# Patient Record
Sex: Female | Born: 1955 | Race: White | Hispanic: No | Marital: Single | Smoking: Never smoker
Health system: Southern US, Community
[De-identification: ages and names within clinical notes are randomized; demographics above are authoritative.]

## PROBLEM LIST (undated history)

## (undated) DIAGNOSIS — F32A Depression, unspecified: Secondary | ICD-10-CM

## (undated) DIAGNOSIS — F329 Major depressive disorder, single episode, unspecified: Secondary | ICD-10-CM

## (undated) DIAGNOSIS — C801 Malignant (primary) neoplasm, unspecified: Secondary | ICD-10-CM

## (undated) HISTORY — PX: BREAST SURGERY: SHX581

## (undated) HISTORY — PX: ABDOMINAL HYSTERECTOMY: SHX81

---

## 2015-12-26 ENCOUNTER — Encounter: Payer: Self-pay | Admitting: *Deleted

## 2015-12-26 ENCOUNTER — Ambulatory Visit (INDEPENDENT_AMBULATORY_CARE_PROVIDER_SITE_OTHER): Payer: BC Managed Care – PPO

## 2015-12-26 ENCOUNTER — Ambulatory Visit
Admission: EM | Admit: 2015-12-26 | Discharge: 2015-12-26 | Disposition: A | Discharge status: Home / Self Care | Payer: BC Managed Care – PPO | Attending: Family Medicine | Admitting: Family Medicine

## 2015-12-26 DIAGNOSIS — M9420 Chondromalacia, unspecified site: Secondary | ICD-10-CM | POA: Diagnosis not present

## 2015-12-26 DIAGNOSIS — S76112A Strain of left quadriceps muscle, fascia and tendon, initial encounter: Secondary | ICD-10-CM

## 2015-12-26 DIAGNOSIS — M25462 Effusion, left knee: Secondary | ICD-10-CM

## 2015-12-26 HISTORY — DX: Malignant (primary) neoplasm, unspecified: C80.1

## 2015-12-26 MED ORDER — KETOROLAC TROMETHAMINE 60 MG/2ML IM SOLN
60.0000 mg | Freq: Once | INTRAMUSCULAR | Status: AC
  Administered 2015-12-26: 60 mg via INTRAMUSCULAR

## 2015-12-26 MED ORDER — NAPROXEN 500 MG PO TABS: 500.0000 mg | ORAL_TABLET | Freq: Two times a day (BID) | ORAL | 0 refills | Status: DC

## 2015-12-26 NOTE — ED Provider Notes (Signed)
CSN: [id number]     Arrival date & time 12/26/15  1841 History   None    Chief Complaint  Patient presents with  . Leg Pain   (Consider location/radiation/quality/duration/timing/severity/associated sxs/prior Treatment) HPI  60 year old female who presents with severe left knee pain anterior and superior ordered while she was at her eye doctor. 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 She did not have pain there but later in the day as she was going to the eye doctor he dilated her eyes and she noticed that the pain was increasing significantly during that time. Pain was so severe she left and drove here and was crying  with pain until she was seen. At the present time she has a marked effusion of the left knee. The pain is localized to the lateral patella and medially along with some retropatellar tenderness. She has difficulty extending her knee because of pain  Past Medical History:  Diagnosis Date  . Cancer [facility])    Past Surgical History:  Procedure Laterality Date  . ABDOMINAL HYSTERECTOMY    . BREAST SURGERY     History reviewed. No pertinent family history. Social History  Substance Use Topics  . Smoking status: Never Smoker  . Smokeless tobacco: Never Used  . Alcohol use No   OB History    No data available     Review of Systems  Constitutional: Positive for activity change. Negative for chills, fatigue and fever.  Musculoskeletal: Positive for arthralgias and myalgias.  All other systems reviewed and are negative.   Allergies  Review of patient's allergies indicates no known allergies.  Home Medications   Prior to Admission medications   Medication Sig Start Date End Date Taking? Authorizing Provider  buPROPion (WELLBUTRIN SR) 150 MG 12 hr tablet Take 150 mg by mouth 2 (two) times daily.   Yes Historical Provider, MD  escitalopram (LEXAPRO) 20 MG  tablet Take 20 mg by mouth daily.   Yes Historical Provider, MD  naproxen (NAPROSYN) 500 MG tablet Take 1 tablet (500 mg total) by mouth 2 (two) times daily with a meal. 12/26/15   Lorin Picket, PA-C   Meds Ordered and Administered this Visit   Medications  ketorolac (TORADOL) injection 60 mg (60 mg Intramuscular Given 12/26/15 1940)    BP 140/75 (BP Location: Left Arm)   Pulse 80   Temp 98.2 F (36.8 C) (Oral)   Resp 20   Ht 5\' 4"  (1.626 m)   Wt 132 lb (59.9 kg)   SpO2 100%   BMI 22.66 kg/m  No data found.   Physical Exam  Constitutional: She appears well-developed and well-nourished. No distress.  HENT:  Head: Normocephalic and atraumatic.  Eyes: EOM are normal. Pupils are equal, round, and reactive to light.  Neck: Normal range of motion. Neck supple.  Musculoskeletal: She exhibits edema and tenderness. She exhibits no deformity.  The left knee shows a 3+ effusion appears mostly suprapatellar. Patient is reluctant to straighten her knee and lift off the table but with support is able to hold it against gravity. She does have some mild retropatellar tenderness and tenderness over the lateral aspects of the patella. There is no palpable defect of the quadriceps insertion. Collateral ligaments are intact and strong and on not tender. There is no joint line tenderness. Inadequate examination was not possible due to the patient's tenderness  and resistance to examination. She did have a adequate response to the Toradol injection.  Skin: She is not diaphoretic.  Nursing note and vitals reviewed.   Urgent Care Course   Clinical Course    Procedures (including critical care time)  Labs Review Labs Reviewed - No data to display  Imaging Review Dg Knee Complete 4 Views Left  Result Date: 12/26/2015 CLINICAL DATA:  Acute worsening of chronic left knee pain. EXAM: LEFT KNEE - COMPLETE 4+ VIEW COMPARISON:  None. FINDINGS: There is no evidence of fracture or dislocation. There is  a large suprapatellar joint effusion. Mild osteoarthritic changes of the medial lateral compartment of the left knee joint are seen. IMPRESSION: No acute fracture or dislocation identified about the left knee. Large suprapatellar joint effusion, raising question for internal derangement of the knee. Mild osteoarthritic changes of the medial and lateral compartment of the knee joint. Electronically Signed   By: Fidela Salisbury M.D.   On: 12/26/2015 20:07    Visual Acuity Review  Right Eye Distance:   Left Eye Distance:   Bilateral Distance:    Right Eye Near:   Left Eye Near:    Bilateral Near:     Medications  ketorolac (TORADOL) injection 60 mg (60 mg Intramuscular Given 12/26/15 1940)  Knee immobilizer was placed on the left leg and she was given crutches  MDM   1. Effusion of left knee joint   2. Quadriceps muscle strain, left, initial encounter   3. Chondromalacia    Discharge Medication List as of 12/26/2015  8:40 PM    START taking these medications   Details  naproxen (NAPROSYN) 500 MG tablet Take 1 tablet (500 mg total) by mouth 2 (two) times daily with a meal., Starting Mon 12/26/2015, Normal      Plan: 1. Test/x-ray results and diagnosis reviewed with patient 2. rx as per orders; risks, benefits, potential side effects reviewed with patient 3. Recommend supportive treatment with Ice and elevation. Advised her to wear her knee immobilizer for everything except for personal care. She has an established orthopedic surgeon at [facility] whom she will call tomorrow for a follow-up appointment. I've given her Naprosyn for pain control and anti-inflammatory. 4. F/u prn if symptoms worsen or don't improve    Lorin Picket, PA-C 12/26/15 2125

## 2015-12-26 NOTE — ED Triage Notes (Signed)
Sudden onset left knee pain this afternoon. No injury involved, pain began while seated for an eye exam. No hx of previous knee problems. No edema or deformity.

## 2016-02-08 ENCOUNTER — Ambulatory Visit
Admission: EM | Admit: 2016-02-08 | Discharge: 2016-02-08 | Disposition: A | Discharge status: Home / Self Care | Payer: BC Managed Care – PPO | Attending: Family Medicine | Admitting: Family Medicine

## 2016-02-08 ENCOUNTER — Encounter: Payer: Self-pay | Admitting: *Deleted

## 2016-02-08 DIAGNOSIS — J069 Acute upper respiratory infection, unspecified: Secondary | ICD-10-CM | POA: Diagnosis not present

## 2016-02-08 MED ORDER — AZITHROMYCIN 250 MG PO TABS: ORAL_TABLET | ORAL | 0 refills | Status: DC

## 2016-02-08 NOTE — ED Triage Notes (Signed)
Sore throat at onset, non-productive cough, chest soreness and pain with coughing/ deep breathing, body aches, and fatigue since Sunday.

## 2016-02-08 NOTE — ED Provider Notes (Signed)
[facility]    CSN: [id number] Arrival date & time: 02/08/16  1627  First Provider Contact:  First MD Initiated Contact with Patient 02/08/16 1656        History   Chief Complaint Chief Complaint  Patient presents with  . Cough  . Pleurisy  . Generalized Body Aches  . Fatigue    HPI Morgan Bennett is a 60 y.o. female.   HPI: Patient presents today with symptoms of mild productive cough, sore throat, nasal congestion for the last few days. The mucus is yellow in color. Patient states that she's had some achiness as well. She is a Oncologist. She denies any documented fever, shortness of breath, chest pain. She does not have a history of asthma or COPD. She is not a smoker.  Past Medical History:  Diagnosis Date  . Cancer ([place])     There are no active problems to display for this patient.   Past Surgical History:  Procedure Laterality Date  . ABDOMINAL HYSTERECTOMY    . BREAST SURGERY      OB History    No data available       Home Medications    Prior to Admission medications   Medication Sig Start Date End Date Taking? Authorizing Provider  Anastrozole (ARIMIDEX PO) Take by mouth.   Yes Historical Provider, MD  buPROPion (WELLBUTRIN SR) 150 MG 12 hr tablet Take 150 mg by mouth 2 (two) times daily.   Yes Historical Provider, MD  calcium carbonate 1250 MG capsule Take 1,250 mg by mouth 2 (two) times daily with a meal.   Yes Historical Provider, MD  cholecalciferol (VITAMIN D) 1000 units tablet Take 1,000 Units by mouth daily.   Yes Historical Provider, MD  escitalopram (LEXAPRO) 20 MG tablet Take 20 mg by mouth daily.   Yes Historical Provider, MD  naproxen (NAPROSYN) 500 MG tablet Take 1 tablet (500 mg total) by mouth 2 (two) times daily with a meal. 12/26/15   Lorin Picket, PA-C    Family History History reviewed. No pertinent family history.  Social History Social History  Substance Use Topics  . Smoking status: Never Smoker  .  Smokeless tobacco: Never Used  . Alcohol use No     Allergies   Review of patient's allergies indicates no known allergies.   Review of Systems Review of Systems: Negative excep tmentioned above.    Physical Exam Triage Vital Signs ED Triage Vitals  Enc Vitals Group     BP 02/08/16 1703 117/62     Pulse Rate 02/08/16 1703 80     Resp 02/08/16 1703 16     Temp 02/08/16 1703 98.8 F (37.1 C)     Temp Source 02/08/16 1703 Oral     SpO2 02/08/16 1703 100 %     Weight 02/08/16 1705 135 lb (61.2 kg)     Height 02/08/16 1705 5\' 4"  (1.626 m)     Head Circumference --      Peak Flow --      Pain Score --      Pain Loc --      Pain Edu? --      Excl. in [place]? --    No data found.   Updated Vital Signs BP 117/62 (BP Location: Right Arm)   Pulse 80   Temp 98.8 F (37.1 C) (Oral)   Resp 16   Ht 5\' 4"  (1.626 m)   Wt 135 lb (61.2 kg)   SpO2  100%   BMI 23.17 kg/m      Physical Exam:  GENERAL: NAD HEENT: no pharyngeal erythema, no exudate, no erythema of TMs, no cervical LAD RESP: CTA B, no accessory muscle use.  CARD: RRR NEURO: CN II-XII grossly intact    UC Treatments / Results  Labs (all labs ordered are listed, but only abnormal results are displayed) Labs Reviewed - No data to display  EKG  EKG Interpretation None       Radiology No results found.  Procedures Procedures (including critical care time)  Medications Ordered in UC Medications - No data to display   Initial Impression / Assessment and Plan / UC Course  I have reviewed the triage vital signs and the nursing notes.  Pertinent labs & imaging results that were available during my care of the patient were reviewed by me and considered in my medical decision making (see chart for details).  Clinical Course   A/P: URI- we'll treat patient with Z-Pak, Delsym when necessary, Claritin when necessary, Tylenol/Motrin when necessary, rest, hydration, seek medical attention if symptoms persist  or worsen as discussed.  Final Clinical Impressions(s) / UC Diagnoses   Final diagnoses:  None    New Prescriptions New Prescriptions   No medications on file     Paulina Fusi, MD 02/08/16 1736

## 2016-07-07 ENCOUNTER — Ambulatory Visit
Admission: EM | Admit: 2016-07-07 | Discharge: 2016-07-07 | Disposition: A | Discharge status: Home / Self Care | Payer: BC Managed Care – PPO | Attending: Family Medicine | Admitting: Family Medicine

## 2016-07-07 DIAGNOSIS — R69 Illness, unspecified: Secondary | ICD-10-CM | POA: Diagnosis not present

## 2016-07-07 DIAGNOSIS — J111 Influenza due to unidentified influenza virus with other respiratory manifestations: Secondary | ICD-10-CM

## 2016-07-07 MED ORDER — OSELTAMIVIR PHOSPHATE 75 MG PO CAPS
75.0000 mg | ORAL_CAPSULE | Freq: Two times a day (BID) | ORAL | 0 refills | Status: DC
Start: 2016-07-07 — End: 2017-04-09

## 2016-07-07 MED ORDER — BENZONATATE 200 MG PO CAPS: ORAL_CAPSULE | ORAL | 0 refills | Status: DC

## 2016-07-07 NOTE — ED Provider Notes (Signed)
CSN: [id number]     Arrival date & time 07/07/16  1531 History   First MD Initiated Contact with Patient 07/07/16 1657     Chief Complaint  Patient presents with  . Cough   (Consider location/radiation/quality/duration/timing/severity/associated sxs/prior Treatment) HPI  This a 61 year old female who presents with symptoms of coughing congestion bodyaches fever and severe chills. Symptoms began yesterday and worsened last night. She states that she is coughing frequently but not excessively. She does not cough throughout the night. Is been in the range of 99- 100 today is 99.2. Works as a Oncologist.      Past Medical History:  Diagnosis Date  . Cancer [facility])    Past Surgical History:  Procedure Laterality Date  . ABDOMINAL HYSTERECTOMY    . BREAST SURGERY     History reviewed. No pertinent family history. Social History  Substance Use Topics  . Smoking status: Never Smoker  . Smokeless tobacco: Never Used  . Alcohol use Yes     Comment: rarely   OB History    No data available     Review of Systems  Constitutional: Positive for activity change, chills, fatigue and fever.  HENT: Positive for congestion.   Respiratory: Positive for cough. Negative for shortness of breath, wheezing and stridor.   Musculoskeletal: Positive for myalgias.  All other systems reviewed and are negative.   Allergies  Patient has no known allergies.  Home Medications   Prior to Admission medications   Medication Sig Start Date End Date Taking? Authorizing Provider  Anastrozole (ARIMIDEX PO) Take by mouth.   Yes Historical Provider, MD  buPROPion (WELLBUTRIN SR) 150 MG 12 hr tablet Take 150 mg by mouth 2 (two) times daily.   Yes Historical Provider, MD  calcium carbonate 1250 MG capsule Take 1,250 mg by mouth 2 (two) times daily with a meal.   Yes Historical Provider, MD  cholecalciferol (VITAMIN D) 1000 units tablet Take 1,000 Units by mouth daily.   Yes Historical Provider, MD   escitalopram (LEXAPRO) 20 MG tablet Take 20 mg by mouth daily.   Yes Historical Provider, MD  benzonatate (TESSALON) 200 MG capsule Take one cap TID PRN cough 07/07/16   Lorin Picket, PA-C  oseltamivir (TAMIFLU) 75 MG capsule Take 1 capsule (75 mg total) by mouth every 12 (twelve) hours. 07/07/16   Lorin Picket, PA-C   Meds Ordered and Administered this Visit  Medications - No data to display  BP 106/63 (BP Location: Left Arm)   Pulse 87   Temp 99.2 F (37.3 C) (Oral)   Resp 16   Ht 5\' 3"  (1.6 m)   Wt 131 lb (59.4 kg)   SpO2 98%   BMI 23.21 kg/m  No data found.   Physical Exam  Constitutional: She is oriented to person, place, and time. She appears well-developed and well-nourished. No distress.  HENT:  Head: Normocephalic and atraumatic.  Right Ear: External ear normal.  Left Ear: External ear normal.  Nose: Nose normal.  Mouth/Throat: Oropharynx is clear and moist. No oropharyngeal exudate.  Eyes: EOM are normal. Pupils are equal, round, and reactive to light. Right eye exhibits no discharge. Left eye exhibits no discharge.  Neck: Normal range of motion. Neck supple.  Pulmonary/Chest: Effort normal and breath sounds normal. No respiratory distress. She has no wheezes. She has no rales.  Musculoskeletal: Normal range of motion.  Lymphadenopathy:    She has no cervical adenopathy.  Neurological: She is alert and oriented to  person, place, and time.  Skin: Skin is warm and dry. She is not diaphoretic.  Psychiatric: She has a normal mood and affect. Her behavior is normal. Judgment and thought content normal.  Nursing note and vitals reviewed.   Urgent Care Course     Procedures (including critical care time)  Labs Review Labs Reviewed - No data to display  Imaging Review No results found.   Visual Acuity Review  Right Eye Distance:   Left Eye Distance:   Bilateral Distance:    Right Eye Near:   Left Eye Near:    Bilateral Near:         MDM   1.  Influenza-like illness     Discharge Medication List as of 07/07/2016  5:13 PM    START taking these medications   Details  benzonatate (TESSALON) 200 MG capsule Take one cap TID PRN cough, Normal    oseltamivir (TAMIFLU) 75 MG capsule Take 1 capsule (75 mg total) by mouth every 12 (twelve) hours., Starting Sat 07/07/2016, Normal      Plan: 1. Test/x-ray results and diagnosis reviewed with patient 2. rx as per orders; risks, benefits, potential side effects reviewed with patient 3. Recommend supportive treatment with Fluids and rest. Use Motrin or Tylenol for body aches and fever. To stay out of school until her cough has subsided and her fever has abated. Is not improving should follow-up with her primary care physician 4. F/u prn if symptoms worsen or don't improve    Lorin Picket, PA-C 07/07/16 1720

## 2016-07-07 NOTE — ED Triage Notes (Signed)
Patient complains of cough, congestion, body aches, fevers. 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

## 2017-04-09 ENCOUNTER — Encounter: Payer: Self-pay | Admitting: Emergency Medicine

## 2017-04-09 ENCOUNTER — Ambulatory Visit
Admission: EM | Admit: 2017-04-09 | Discharge: 2017-04-09 | Disposition: A | Discharge status: Home / Self Care | Payer: BC Managed Care – PPO | Attending: Family Medicine | Admitting: Family Medicine

## 2017-04-09 DIAGNOSIS — J069 Acute upper respiratory infection, unspecified: Secondary | ICD-10-CM | POA: Diagnosis not present

## 2017-04-09 DIAGNOSIS — R05 Cough: Secondary | ICD-10-CM

## 2017-04-09 MED ORDER — ALBUTEROL SULFATE HFA 108 (90 BASE) MCG/ACT IN AERS: 1.0000 | INHALATION_SPRAY | Freq: Four times a day (QID) | RESPIRATORY_TRACT | 0 refills | Status: DC | PRN

## 2017-04-09 MED ORDER — HYDROCOD POLST-CPM POLST ER 10-8 MG/5ML PO SUER: 5.0000 mL | Freq: Two times a day (BID) | ORAL | 0 refills | Status: DC

## 2017-04-09 NOTE — ED Triage Notes (Signed)
Patient c/o cough, chest congestion and bodyaches since Saturday.

## 2017-04-09 NOTE — ED Provider Notes (Signed)
[facility]    CSN: [id number] Arrival date & time: 04/09/17  1143     History   Chief Complaint Chief Complaint  Patient presents with  . Cough    HPI Morgan Bennett is a 61 y.o. female.   HPI  This a 61 year old female who presents with cough chest congestion and body aches that she's had since Saturday 3 days prior to this visit. In addition she is complaining of pain the medial scapular border on the left near the superior spine. Is non-radiating. She states that it hurts with deep breathing or coughing. It feels better when she leans forward. She states that last night she had a particularly long coughing spell and that she has the beside the bed to relieve the pain in her back. She's not had a fever or chills. O2 sats are 100% on room air.        Past Medical History:  Diagnosis Date  . Cancer ([place])     There are no active problems to display for this patient.   Past Surgical History:  Procedure Laterality Date  . ABDOMINAL HYSTERECTOMY    . BREAST SURGERY      OB History    No data available       Home Medications    Prior to Admission medications   Medication Sig Start Date End Date Taking? Authorizing Provider  albuterol (PROVENTIL HFA;VENTOLIN HFA) 108 (90 Base) MCG/ACT inhaler Inhale 1-2 puffs every 6 (six) hours as needed into the lungs for wheezing or shortness of breath. Use with spacer 04/09/17   Lorin Picket, PA-C  Anastrozole (ARIMIDEX PO) Take by mouth.    [provider]  buPROPion (WELLBUTRIN SR) 150 MG 12 hr tablet Take 150 mg by mouth 2 (two) times daily.    [provider]  calcium carbonate 1250 MG capsule Take 1,250 mg by mouth 2 (two) times daily with a meal.    [provider]  chlorpheniramine-HYDROcodone (TUSSIONEX PENNKINETIC ER) 10-8 MG/5ML SUER Take 5 mLs 2 (two) times daily by mouth. 04/09/17   Lorin Picket, PA-C  cholecalciferol (VITAMIN D) 1000 units tablet Take 1,000 Units by  mouth daily.    [provider]  escitalopram (LEXAPRO) 20 MG tablet Take 20 mg by mouth daily.    [provider]    Family History History reviewed. No pertinent family history.  Social History Social History   Tobacco Use  . Smoking status: Never Smoker  . Smokeless tobacco: Never Used  Substance Use Topics  . Alcohol use: Yes    Comment: rarely  . Drug use: No     Allergies   Patient has no known allergies.   Review of Systems Review of Systems  Constitutional: Positive for activity change and fatigue. Negative for chills and fever.  HENT: Positive for congestion and sore throat.   Respiratory: Positive for cough and shortness of breath.   Musculoskeletal: Positive for myalgias.  All other systems reviewed and are negative.    Physical Exam Triage Vital Signs ED Triage Vitals  Enc Vitals Group     BP 04/09/17 1201 129/81     Pulse Rate 04/09/17 1201 87     Resp 04/09/17 1201 14     Temp 04/09/17 1201 98.6 F (37 C)     Temp Source 04/09/17 1201 Oral     SpO2 04/09/17 1201 100 %     Weight 04/09/17 1158 125 lb (56.7 kg)  Height 04/09/17 1158 5\' 4"  (1.626 m)     Head Circumference --      Peak Flow --      Pain Score 04/09/17 1159 5     Pain Loc --      Pain Edu? --      Excl. in [place]? --    No data found.  Updated Vital Signs BP 129/81 (BP Location: Left Arm)   Pulse 87   Temp 98.6 F (37 C) (Oral)   Resp 14   Ht 5\' 4"  (1.626 m)   Wt 125 lb (56.7 kg)   SpO2 100%   BMI 21.46 kg/m   Visual Acuity Right Eye Distance:   Left Eye Distance:   Bilateral Distance:    Right Eye Near:   Left Eye Near:    Bilateral Near:     Physical Exam  Constitutional: She is oriented to person, place, and time.  HENT:  Head: Normocephalic.  Right Ear: External ear normal.  Left Ear: External ear normal.  Nose: Nose normal.  Mouth/Throat: Oropharynx is clear and moist. No oropharyngeal exudate.  Eyes: Pupils are equal, round, and  reactive to light. Right eye exhibits no discharge. Left eye exhibits no discharge.  Neck: Normal range of motion.  Pulmonary/Chest: Effort normal and breath sounds normal.  Musculoskeletal: She exhibits tenderness.   She does have point tends over left mediasenal border at the superior spine level. this reproduces her symptoms. There is no crepitus present.  Neurological: She is alert and oriented to person, place, and time.  Skin: Skin is warm and dry.  Psychiatric: She has a normal mood and affect. Her behavior is normal. Judgment and thought content normal.  Nursing note and vitals reviewed.    UC Treatments / Results  Labs (all labs ordered are listed, but only abnormal results are displayed) Labs Reviewed - No data to display  EKG  EKG Interpretation None       Radiology No results found.  Procedures Procedures (including critical care time)  Medications Ordered in UC Medications - No data to display   Initial Impression / Assessment and Plan / UC Course  I have reviewed the triage vital signs and the nursing notes.  Pertinent labs & imaging results that were available during my care of the patient were reviewed by me and considered in my medical decision making (see chart for details).     Plan: 1. Test/x-ray results and diagnosis reviewed with patient 2. rx as per orders; risks, benefits, potential side effects reviewed with patient 3. Recommend supportive treatment with rest and fluids. Discussion regarding use of Tussionex at night. She should not drive if taking the medication. If she is worsening or develops high fevers a more productive cough more shortness of breath that she should return to our clinic or the emergency room or her primary care physician. 4. F/u prn if symptoms worsen or don't improve   Final Clinical Impressions(s) / UC Diagnoses   Final diagnoses:  Upper respiratory tract infection, unspecified type    ED Discharge Orders         Ordered    chlorpheniramine-HYDROcodone (TUSSIONEX PENNKINETIC ER) 10-8 MG/5ML SUER  2 times daily     04/09/17 1255    albuterol (PROVENTIL HFA;VENTOLIN HFA) 108 (90 Base) MCG/ACT inhaler  Every 6 hours PRN    Comments:  Provide spacer and instructions to patient   04/09/17 1255       Controlled Substance Prescriptions [place] Controlled Substance  Registry consulted? Not Applicable   Lorin Picket, PA-C 04/09/17 1321

## 2017-09-09 ENCOUNTER — Ambulatory Visit: Payer: BC Managed Care – PPO | Admitting: Family Medicine

## 2017-09-09 ENCOUNTER — Encounter: Payer: Self-pay | Admitting: Family Medicine

## 2017-09-09 VITALS — BP 110/62 | HR 120 | Temp 99.3°F | Ht 64.0 in | Wt 132.0 lb

## 2017-09-09 DIAGNOSIS — J020 Streptococcal pharyngitis: Secondary | ICD-10-CM | POA: Diagnosis not present

## 2017-09-09 MED ORDER — AZITHROMYCIN 250 MG PO TABS
ORAL_TABLET | ORAL | 0 refills | Status: DC
Start: 2017-09-09 — End: 2017-11-26

## 2017-09-09 NOTE — Progress Notes (Signed)
Name: Morgan Bennett   MRN: [id number]    DOB: 30-Sep-1955   Date:09/09/2017       Progress Note  Subjective  Chief Complaint  Chief Complaint  Patient presents with  . Establish Care    pcp retired  . Sore Throat    feels "thick in the back of throat"- achy, but feels better today. No appetite    Sore Throat   The current episode started yesterday. The problem has been gradually worsening. Neither side of throat is experiencing more pain than the other. The maximum temperature recorded prior to her arrival was 100.4 - 100.9 F. The pain is at a severity of 4/10. The pain is moderate. Pertinent negatives include no abdominal pain, congestion, coughing, diarrhea, drooling, ear discharge, ear pain, headaches, hoarse voice, plugged ear sensation, neck pain, shortness of breath, stridor, swollen glands, trouble swallowing or vomiting. She has had no exposure to strep or mono. 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 The treatment provided moderate relief.    No problem-specific Assessment & Plan notes found for this encounter.   Past Medical History:  Diagnosis Date  . Cancer [facility])     Past Surgical History:  Procedure Laterality Date  . ABDOMINAL HYSTERECTOMY    . BREAST SURGERY     2 breast ca surgeries- mastectomy     Family History  Problem Relation Age of Onset  . Cancer Mother   . Cancer Sister     Social History   Socioeconomic History  . Marital status: Single    Spouse name: Not on file  . Number of children: Not on file  . Years of education: Not on file  . Highest education level: Not on file  Occupational History  . Not on file  Social Needs  . Financial resource strain: Not on file  . Food insecurity:    Worry: Not on file    Inability: Not on file  . Transportation needs:    Medical: Not on file    Non-medical: Not on file  Tobacco Use  . Smoking status: Never Smoker  . Smokeless tobacco: Never Used  Substance and Sexual Activity  . Alcohol use: Yes   Comment: rarely  . Drug use: No  . Sexual activity: Not Currently  Lifestyle  . Physical activity:    Days per week: Not on file    Minutes per session: Not on file  . Stress: Not on file  Relationships  . Social connections:    Talks on phone: Not on file    Gets together: Not on file    Attends religious service: Not on file    Active member of club or organization: Not on file    Attends meetings of clubs or organizations: Not on file    Relationship status: Not on file  . Intimate partner violence:    Fear of current or ex partner: Not on file    Emotionally abused: Not on file    Physically abused: Not on file    Forced sexual activity: Not on file  Other Topics Concern  . Not on file  Social History Narrative  . Not on file    No Known Allergies  Outpatient Medications Prior to Visit  Medication Sig Dispense Refill  . Anastrozole (ARIMIDEX PO) Take by mouth.    Marland Kitchen buPROPion (WELLBUTRIN SR) 150 MG 12 hr tablet Take 150 mg by mouth 2 (two) times daily. oncologist    . escitalopram (LEXAPRO) 20 MG tablet Take 20 mg  by mouth daily.    Marland Kitchen albuterol (PROVENTIL HFA;VENTOLIN HFA) 108 (90 Base) MCG/ACT inhaler Inhale 1-2 puffs every 6 (six) hours as needed into the lungs for wheezing or shortness of breath. Use with spacer 1 Inhaler 0  . calcium carbonate 1250 MG capsule Take 1,250 mg by mouth 2 (two) times daily with a meal.    . chlorpheniramine-HYDROcodone (TUSSIONEX PENNKINETIC ER) 10-8 MG/5ML SUER Take 5 mLs 2 (two) times daily by mouth. 115 mL 0  . cholecalciferol (VITAMIN D) 1000 units tablet Take 1,000 Units by mouth daily.     No facility-administered medications prior to visit.     Review of Systems  Constitutional: Negative for chills, fever, malaise/fatigue and weight loss.  HENT: Negative for congestion, drooling, ear discharge, ear pain, hoarse voice, sore throat and trouble swallowing.   Eyes: Negative for blurred vision.  Respiratory: Negative for cough,  sputum production, shortness of breath, wheezing and stridor.   Cardiovascular: Negative for chest pain, palpitations and leg swelling.  Gastrointestinal: Negative for abdominal pain, blood in stool, constipation, diarrhea, heartburn, melena, nausea and vomiting.  Genitourinary: Negative for dysuria, frequency, hematuria and urgency.  Musculoskeletal: Negative for back pain, joint pain, myalgias and neck pain.  Skin: Negative for rash.  Neurological: Negative for dizziness, tingling, sensory change, focal weakness and headaches.  Endo/Heme/Allergies: Negative for environmental allergies and polydipsia. Does not bruise/bleed easily.  Psychiatric/Behavioral: Negative for depression and suicidal ideas. The patient is not nervous/anxious and does not have insomnia.      Objective  Vitals:   09/09/17 1522  BP: 110/62  Pulse: (!) 120  Temp: 99.3 F (37.4 C)  TempSrc: Oral  Weight: 132 lb (59.9 kg)  Height: 5\' 4"  (1.626 m)    Physical Exam  Constitutional: She is well-developed, well-nourished, and in no distress. No distress.  HENT:  Head: Normocephalic and atraumatic.  Right Ear: Tympanic membrane, external ear and ear canal normal.  Left Ear: Tympanic membrane, external ear and ear canal normal.  Nose: Nose normal. Right sinus exhibits no maxillary sinus tenderness and no frontal sinus tenderness. Left sinus exhibits no maxillary sinus tenderness and no frontal sinus tenderness.  Mouth/Throat: Uvula is midline, oropharynx is clear and moist and mucous membranes are normal. No oropharyngeal exudate, posterior oropharyngeal edema or posterior oropharyngeal erythema.  Eyes: Pupils are equal, round, and reactive to light. Conjunctivae and EOM are normal. Right eye exhibits no discharge. Left eye exhibits no discharge.  Neck: Normal range of motion. Neck supple. No JVD present. No thyromegaly present.  Cardiovascular: Normal rate, regular rhythm, normal heart sounds and intact distal  pulses. Exam reveals no gallop and no friction rub.  No murmur heard. Pulmonary/Chest: Effort normal and breath sounds normal. She has no wheezes. She has no rales.  Abdominal: Soft. Bowel sounds are normal. She exhibits no mass. There is no tenderness. There is no guarding.  Musculoskeletal: Normal range of motion. She exhibits no edema.  Lymphadenopathy:       Head (right side): Submandibular adenopathy present.       Head (left side): Submandibular adenopathy present.    She has no cervical adenopathy.  Neurological: She is alert. She has normal reflexes.  Skin: Skin is warm and dry. She is not diaphoretic.  Psychiatric: Mood and affect normal.  Nursing note and vitals reviewed.     Assessment & Plan  Problem List Items Addressed This Visit    None    Visit Diagnoses    Pharyngitis due to Streptococcus  species    -  Primary   Relevant Medications   azithromycin (ZITHROMAX) 250 MG tablet      Meds ordered this encounter  Medications  . azithromycin (ZITHROMAX) 250 MG tablet    Sig: 2 today then 1 a day for 4 days    Dispense:  6 tablet    Refill:  0      Dr. [facility]  09/09/17

## 2017-09-27 ENCOUNTER — Other Ambulatory Visit: Payer: Self-pay

## 2017-09-27 ENCOUNTER — Telehealth: Payer: Self-pay

## 2017-09-27 MED ORDER — AMOXICILLIN 500 MG PO CAPS: 500.0000 mg | ORAL_CAPSULE | Freq: Three times a day (TID) | ORAL | 0 refills | Status: DC

## 2017-09-27 NOTE — Telephone Encounter (Signed)
Pt called in with sore throat again, low grade fever. Sent in Amox to [facility]

## 2017-10-30 ENCOUNTER — Encounter: Payer: Self-pay | Admitting: Emergency Medicine

## 2017-10-30 ENCOUNTER — Ambulatory Visit (INDEPENDENT_AMBULATORY_CARE_PROVIDER_SITE_OTHER): Payer: BC Managed Care – PPO

## 2017-10-30 ENCOUNTER — Other Ambulatory Visit: Payer: Self-pay

## 2017-10-30 ENCOUNTER — Ambulatory Visit
Admission: EM | Admit: 2017-10-30 | Discharge: 2017-10-30 | Disposition: A | Discharge status: Home / Self Care | Payer: BC Managed Care – PPO | Attending: Family Medicine | Admitting: Family Medicine

## 2017-10-30 DIAGNOSIS — R079 Chest pain, unspecified: Secondary | ICD-10-CM | POA: Diagnosis not present

## 2017-10-30 DIAGNOSIS — Z79899 Other long term (current) drug therapy: Secondary | ICD-10-CM | POA: Insufficient documentation

## 2017-10-30 DIAGNOSIS — R0789 Other chest pain: Secondary | ICD-10-CM | POA: Diagnosis not present

## 2017-10-30 DIAGNOSIS — R509 Fever, unspecified: Secondary | ICD-10-CM | POA: Insufficient documentation

## 2017-10-30 DIAGNOSIS — R05 Cough: Secondary | ICD-10-CM | POA: Diagnosis not present

## 2017-10-30 DIAGNOSIS — I517 Cardiomegaly: Secondary | ICD-10-CM | POA: Diagnosis not present

## 2017-10-30 DIAGNOSIS — Z9013 Acquired absence of bilateral breasts and nipples: Secondary | ICD-10-CM | POA: Diagnosis not present

## 2017-10-30 DIAGNOSIS — Z853 Personal history of malignant neoplasm of breast: Secondary | ICD-10-CM | POA: Insufficient documentation

## 2017-10-30 DIAGNOSIS — R918 Other nonspecific abnormal finding of lung field: Secondary | ICD-10-CM | POA: Insufficient documentation

## 2017-10-30 LAB — CBC WITH DIFFERENTIAL/PLATELET
Basophils Absolute: 0.1 10*3/uL (ref 0–0.1)
Basophils Relative: 1 %
EOS ABS: 0.1 10*3/uL (ref 0–0.7)
EOS PCT: 0 %
HCT: 36.3 % (ref 35.0–47.0)
Hemoglobin: 12.2 g/dL (ref 12.0–16.0)
LYMPHS ABS: 1.4 10*3/uL (ref 1.0–3.6)
LYMPHS PCT: 12 %
MCH: 31.2 pg (ref 26.0–34.0)
MCHC: 33.7 g/dL (ref 32.0–36.0)
MCV: 92.6 fL (ref 80.0–100.0)
MONO ABS: 0.8 10*3/uL (ref 0.2–0.9)
MONOS PCT: 7 %
Neutro Abs: 9.5 10*3/uL — ABNORMAL HIGH (ref 1.4–6.5)
Neutrophils Relative %: 80 %
PLATELETS: 303 10*3/uL (ref 150–440)
RBC: 3.92 MIL/uL (ref 3.80–5.20)
RDW: 12.9 % (ref 11.5–14.5)
WBC: 11.8 10*3/uL — AB (ref 3.6–11.0)

## 2017-10-30 MED ORDER — IBUPROFEN 600 MG PO TABS
600.0000 mg | ORAL_TABLET | Freq: Once | ORAL | Status: AC
  Administered 2017-10-30: 600 mg via ORAL

## 2017-10-30 NOTE — ED Provider Notes (Signed)
[facility]    CSN: [id number] Arrival date & time: 10/30/17  1841     History   Chief Complaint Chief Complaint  Patient presents with  . Chest Pain    HPI Morgan Bennett is a 62 y.o. female.   62 yo female with a c/o sharp chest pains since this afternoon worse when taking a deep breath. Patient states that she's been ill on and off for the past month with respiratory symptoms and was treated with antibiotics by PCP. States she's had a chronic cough that keeps her up at night. Sharp chest pain is localized to the left upper chest area. Denies any calf pain, hormonal treatment or prolong immobilization. Has a h/o breast cancer for which she follows up with her oncologist at [facility] in [place] (records not available in [place]). Patient states this has been in remission for the past 20 years. Also states she has a h/o osteoporosis and has had fractured ribs in the past.   The history is provided by the patient.  Chest Pain    Past Medical History:  Diagnosis Date  . Cancer ([place])     There are no active problems to display for this patient.   Past Surgical History:  Procedure Laterality Date  . ABDOMINAL HYSTERECTOMY    . BREAST SURGERY     2 breast ca surgeries- mastectomy     OB History   None      Home Medications    Prior to Admission medications   Medication Sig Start Date End Date Taking? Authorizing Provider  Anastrozole (ARIMIDEX PO) Take by mouth.   Yes [provider]  buPROPion (WELLBUTRIN SR) 150 MG 12 hr tablet Take 150 mg by mouth 2 (two) times daily. oncologist   Yes [provider]  escitalopram (LEXAPRO) 20 MG tablet Take 20 mg by mouth daily.   Yes [provider]  amoxicillin (AMOXIL) 500 MG capsule Take 1 capsule (500 mg total) by mouth 3 (three) times daily. 09/27/17   Juline Patch, MD  azithromycin (ZITHROMAX) 250 MG tablet 2 today then 1 a day for 4 days 09/09/17   Juline Patch, MD    Family  History Family History  Problem Relation Age of Onset  . Cancer Mother   . Cancer Sister   . COPD Father     Social History Social History   Tobacco Use  . Smoking status: Never Smoker  . Smokeless tobacco: Never Used  Substance Use Topics  . Alcohol use: Yes    Comment: rarely  . Drug use: No     Allergies   Patient has no known allergies.   Review of Systems Review of Systems  Cardiovascular: Positive for chest pain.     Physical Exam Triage Vital Signs ED Triage Vitals  Enc Vitals Group     BP 10/30/17 1848 (!) 160/79     Pulse Rate 10/30/17 1848 99     Resp 10/30/17 1848 20     Temp 10/30/17 1848 100.1 F (37.8 C)     Temp Source 10/30/17 1848 Oral     SpO2 10/30/17 1848 100 %     Weight 10/30/17 1850 135 lb (61.2 kg)     Height 10/30/17 1850 5\' 4"  (1.626 m)     Head Circumference --      Peak Flow --      Pain Score 10/30/17 1849 8     Pain Loc --  Pain Edu? --      Excl. in [place]? --    No data found.  Updated Vital Signs BP (!) 160/79 (BP Location: Right Arm)   Pulse 99   Temp 100.1 F (37.8 C) (Oral)   Resp 20   Ht 5\' 4"  (1.626 m)   Wt 135 lb (61.2 kg)   SpO2 100%   BMI 23.17 kg/m   Visual Acuity Right Eye Distance:   Left Eye Distance:   Bilateral Distance:    Right Eye Near:   Left Eye Near:    Bilateral Near:     Physical Exam  Constitutional: She appears well-developed and well-nourished. No distress.  Cardiovascular: Normal rate, regular rhythm, normal heart sounds and intact distal pulses.  Pulmonary/Chest: Effort normal and breath sounds normal. No stridor. No respiratory distress. She has no wheezes. She has no rales. She exhibits tenderness (left chest wall; reproducible).  Abdominal: Soft. Bowel sounds are normal. She exhibits no distension.  Musculoskeletal: She exhibits no edema or tenderness.  Skin: No rash noted. She is not diaphoretic.  Nursing note and vitals reviewed.    UC Treatments / Results  Labs (all  labs ordered are listed, but only abnormal results are displayed) Labs Reviewed  CBC WITH DIFFERENTIAL/PLATELET - Abnormal; Notable for the following components:      Result Value   WBC 11.8 (*)    Neutro Abs 9.5 (*)    All other components within normal limits    EKG None  Radiology Dg Chest 2 View  Result Date: 10/30/2017 CLINICAL DATA:  History of left breast cancer. Left anterior chest pain. Fevers. EXAM: CHEST - 2 VIEW COMPARISON:  None. FINDINGS: There is a mass in the left mid lung measuring 5.6 by 2.7 cm. This is denser than adjacent bone, consistent with a calcified mass. Along the superior aspect of this mass is some nodularity which is probably but not definitively calcified. There is increased density in the left apex, asymmetric to the right. No other nodules or masses. Cardiomediastinal silhouette is normal. No other acute abnormalities. IMPRESSION: 1. There is a calcified mass in the left lung. Adjacent nodularity is probably calcified but not definitively, likely part of the same process. 2. Increased heterogeneous density in the left apex. This region is obscured by overlapping bones but the finding is asymmetric to the right. Recommend comparison to previous outside films if available. If none are available, recommend CT imaging. Electronically Signed   By: Dorise Bullion III M.D   On: 10/30/2017 19:58   Dg Ribs Unilateral Left  Result Date: 10/30/2017 CLINICAL DATA:  Fever for several months.  Left anterior chest pain. EXAM: LEFT RIBS - 2 VIEW COMPARISON:  None. FINDINGS: Mild increased density in the left apex, asymmetric to the right. Calcified mass in the left mid lung. Adjacent nodularity may be but is not definitely calcified as well. No rib fractures or bony lesions identified. IMPRESSION: 1. Lung findings as above. See the chest x-ray report and recommendations. 2. No bony lesions identified. Electronically Signed   By: Dorise Bullion III M.D   On: 10/30/2017 20:00     Procedures ED EKG Date/Time: 10/30/2017 7:32 PM Performed by: Norval Gable, MD Authorized by: Norval Gable, MD   ECG reviewed by ED Physician in the absence of a cardiologist: no   Previous ECG:    Previous ECG:  Unavailable Interpretation:    Interpretation: normal   Rate:    ECG rate:  97   ECG  rate assessment: normal   Rhythm:    Rhythm: sinus rhythm   Ectopy:    Ectopy: none   QRS:    QRS axis:  Normal Conduction:    Conduction: normal   ST segments:    ST segments:  Normal T waves:    T waves: normal   Q waves:    Q waves:  V1 and V2   (including critical care time)  Medications Ordered in UC Medications  ibuprofen (ADVIL,MOTRIN) tablet 600 mg (600 mg Oral Given 10/30/17 1931)    Initial Impression / Assessment and Plan / UC Course  I have reviewed the triage vital signs and the nursing notes.  Pertinent labs & imaging results that were available during my care of the patient were reviewed by me and considered in my medical decision making (see chart for details).      Final Clinical Impressions(s) / UC Diagnoses   Final diagnoses:  Mass of left lung     Discharge Instructions     Recommend follow up with your oncologist tomorrow for further evaluation    ED Prescriptions    None     1. Labs/x-ray results and diagnosis reviewed with patient 2. Recommend patient follow up with her oncologist tomorrow for further evaluation 3. Offered rx for cough syrup, however patient declined.    Controlled Substance Prescriptions [place] Controlled Substance Registry consulted? Not Applicable   Norval Gable, MD 10/30/17 2119

## 2017-10-30 NOTE — Discharge Instructions (Signed)
Recommend follow up with your oncologist tomorrow for further evaluation

## 2017-10-30 NOTE — ED Triage Notes (Signed)
Patient c/o chest pain that started this afternoon, denies nausea and vomiting. States pain got worse over the last 30 min. Describes pain as a stabbing pain.

## 2017-11-26 ENCOUNTER — Other Ambulatory Visit: Payer: Self-pay

## 2017-11-26 ENCOUNTER — Ambulatory Visit
Admission: EM | Admit: 2017-11-26 | Discharge: 2017-11-26 | Disposition: A | Discharge status: Home / Self Care | Payer: BC Managed Care – PPO | Attending: Family Medicine | Admitting: Family Medicine

## 2017-11-26 DIAGNOSIS — R112 Nausea with vomiting, unspecified: Secondary | ICD-10-CM | POA: Diagnosis not present

## 2017-11-26 DIAGNOSIS — R1031 Right lower quadrant pain: Secondary | ICD-10-CM | POA: Diagnosis not present

## 2017-11-26 HISTORY — DX: Depression, unspecified: F32.A

## 2017-11-26 HISTORY — DX: Major depressive disorder, single episode, unspecified: F32.9

## 2017-11-26 LAB — COMPREHENSIVE METABOLIC PANEL
ALBUMIN: 4.3 g/dL (ref 3.5–5.0)
ALT: 14 U/L (ref 0–44)
AST: 22 U/L (ref 15–41)
Alkaline Phosphatase: 60 U/L (ref 38–126)
Anion gap: 13 (ref 5–15)
BILIRUBIN TOTAL: 0.5 mg/dL (ref 0.3–1.2)
BUN: 22 mg/dL (ref 8–23)
CO2: 23 mmol/L (ref 22–32)
CREATININE: 0.74 mg/dL (ref 0.44–1.00)
Calcium: 9.2 mg/dL (ref 8.9–10.3)
Chloride: 100 mmol/L (ref 98–111)
GFR calc Af Amer: >60 mL/min (ref >60)
GFR calc non Af Amer: >60 mL/min (ref >60)
GLUCOSE: 112 mg/dL — AB (ref 70–99)
Potassium: 4 mmol/L (ref 3.5–5.1)
Sodium: 136 mmol/L (ref 135–145)
Total Protein: 7.8 g/dL (ref 6.5–8.1)

## 2017-11-26 LAB — CBC WITH DIFFERENTIAL/PLATELET
BASOS ABS: 0.1 10*3/uL (ref 0–0.1)
BASOS PCT: 1 %
Eosinophils Absolute: 0.1 10*3/uL (ref 0–0.7)
Eosinophils Relative: 1 %
HEMATOCRIT: 38.4 % (ref 35.0–47.0)
Hemoglobin: 13.1 g/dL (ref 12.0–16.0)
LYMPHS PCT: 27 %
Lymphs Abs: 1.9 10*3/uL (ref 1.0–3.6)
MCH: 30.9 pg (ref 26.0–34.0)
MCHC: 34.1 g/dL (ref 32.0–36.0)
MCV: 90.8 fL (ref 80.0–100.0)
MONO ABS: 0.6 10*3/uL (ref 0.2–0.9)
Monocytes Relative: 8 %
NEUTROS ABS: 4.4 10*3/uL (ref 1.4–6.5)
NEUTROS PCT: 63 %
Platelets: 282 10*3/uL (ref 150–440)
RBC: 4.23 MIL/uL (ref 3.80–5.20)
RDW: 12.5 % (ref 11.5–14.5)
WBC: 7 10*3/uL (ref 3.6–11.0)

## 2017-11-26 MED ORDER — KETOROLAC TROMETHAMINE 30 MG/ML IJ SOLN
60.0000 mg | Freq: Once | INTRAMUSCULAR | Status: AC
  Administered 2017-11-26: 60 mg via INTRAMUSCULAR

## 2017-11-26 NOTE — Discharge Instructions (Addendum)
Go straight to the hospital.  Take care  Dr. Lacinda Axon   ** Patient with severe RLQ pain. Labs unremarkable. Needs imaging. Thank you. Thersa Salt DO

## 2017-11-26 NOTE — ED Provider Notes (Signed)
[facility]  CSN: [id number] Arrival date & time: 11/26/17  1350  History   Chief Complaint Chief Complaint  Patient presents with  . Abdominal Pain   HPI  62 year old female presents with severe abdominal pain.  Patient reports that she has been on antibiotic therapy recently for pneumonia.  She has just completed her antibiotic.  She states that for the past few days she has had mild abdominal pain.  Worsened  this afternoon.  She reports severe lower abdominal pain.  She has had approximately 4 episodes of emesis today as well.  She had a normal bowel movement earlier today.  No diarrhea.  Her pain is currently severe, 9/10 in severity.  No medications or interventions tried.  No other associated symptoms.  No other complaints.  Past Medical History:  Diagnosis Date  . Cancer ([place])   . Depression    Past Surgical History:  Procedure Laterality Date  . ABDOMINAL HYSTERECTOMY    . BREAST SURGERY     2 breast ca surgeries- mastectomy    OB History   None    Home Medications    Prior to Admission medications   Medication Sig Start Date End Date Taking? Authorizing Provider  Anastrozole (ARIMIDEX PO) Take by mouth.    [provider]  buPROPion (WELLBUTRIN SR) 150 MG 12 hr tablet Take 150 mg by mouth 2 (two) times daily. oncologist    [provider]  escitalopram (LEXAPRO) 20 MG tablet Take 20 mg by mouth daily.    [provider]   Family History Family History  Problem Relation Age of Onset  . Cancer Mother   . Cancer Sister   . COPD Father    Social History Social History   Tobacco Use  . Smoking status: Never Smoker  . Smokeless tobacco: Never Used  Substance Use Topics  . Alcohol use: Yes    Comment: rarely  . Drug use: No     Allergies   Patient has no known allergies.   Review of Systems Review of Systems  Constitutional: Negative for fever.  Gastrointestinal: Positive for abdominal pain, nausea and  vomiting. Negative for constipation and diarrhea.   Physical Exam Triage Vital Signs ED Triage Vitals [11/26/17 1400]  Enc Vitals Group     BP (!) 147/77     Pulse Rate 75     Resp 20     Temp 97.8 F (36.6 C)     Temp Source Oral     SpO2 100 %     Weight 135 lb (61.2 kg)     Height 5\' 4"  (1.626 m)     Head Circumference      Peak Flow      Pain Score 9     Pain Loc      Pain Edu?      Excl. in [place]?    Updated Vital Signs BP (!) 147/77 (BP Location: Left Arm)   Pulse 75   Temp 97.8 F (36.6 C) (Oral)   Resp 20   Ht 5\' 4"  (1.626 m)   Wt 135 lb (61.2 kg)   SpO2 100%   BMI 23.17 kg/m   Physical Exam  Constitutional: She is oriented to person, place, and time. She appears well-developed.  In distress secondary to pain.  HENT:  Head: Normocephalic and atraumatic.  Cardiovascular: Normal rate and regular rhythm.  Pulmonary/Chest: Effort normal and breath sounds normal. She has no wheezes. She has no rales.  Abdominal: Soft. She exhibits no distension.  Patient with tenderness to palpation in the right lower quadrant.  Neurological: She is alert and oriented to person, place, and time.  Nursing note and vitals reviewed.  UC Treatments / Results  Labs (all labs ordered are listed, but only abnormal results are displayed) Labs Reviewed  COMPREHENSIVE METABOLIC PANEL - Abnormal; Notable for the following components:      Result Value   Glucose, Bld 112 (*)    All other components within normal limits  CBC WITH DIFFERENTIAL/PLATELET    EKG None  Radiology No results found.  Procedures Procedures (including critical care time)  Medications Ordered in UC Medications  ketorolac (TORADOL) 30 MG/ML injection 60 mg (60 mg Intramuscular Given 11/26/17 1443)    Initial Impression / Assessment and Plan / UC Course  I have reviewed the triage vital signs and the nursing notes.  Pertinent labs & imaging results that were available during my care of the patient were  reviewed by me and considered in my medical decision making (see chart for details).    62 year old female presents with severe right lower quadrant pain.  Labs obtained and were unrevealing.  Patient needs imaging.  Patient was given Toradol here after her renal function was normal.  Patient going to [facility] ER.  Final Clinical Impressions(s) / UC Diagnoses   Final diagnoses:  RLQ abdominal pain     Discharge Instructions     Go straight to the hospital.  Take care  Dr. Lacinda Axon   ** Patient with severe RLQ pain. Labs unremarkable. Needs imaging. Thank you. Thersa Salt DO    ED Prescriptions    None     Controlled Substance Prescriptions [place] Controlled Substance Registry consulted? Not Applicable   Coral Spikes, DO 11/26/17 1452

## 2017-11-26 NOTE — ED Triage Notes (Signed)
Several days of low grade abd pain but today got much worse low across abdomen and almost feels like a bad period cramp. Had one episode of vomiting today. Had normal BM today. Pain 9/10

## 2017-12-02 ENCOUNTER — Other Ambulatory Visit: Payer: Self-pay

## 2017-12-02 ENCOUNTER — Ambulatory Visit: Payer: BC Managed Care – PPO | Admitting: Family Medicine

## 2017-12-02 ENCOUNTER — Encounter: Payer: Self-pay | Admitting: Family Medicine

## 2017-12-02 VITALS — BP 138/70 | HR 72 | Ht 64.0 in | Wt 140.0 lb

## 2017-12-02 DIAGNOSIS — N200 Calculus of kidney: Secondary | ICD-10-CM

## 2017-12-02 NOTE — Progress Notes (Signed)
Name: Morgan Bennett   MRN: [id number]    DOB: [birth year]   Date:12/02/2017       Progress Note  Subjective  Chief Complaint  Chief Complaint  Patient presents with  . Nephrolithiasis    Dx by CT of abdomen in ER. Feels like stone may be in bladder now because has been free of pain and nausea today.    Flank Pain  This is a new problem. The current episode started in the past 7 days (6 days). The problem has been gradually improving (resoved presently) since onset. The quality of the pain is described as stabbing. The pain is at a severity of 1/10. The pain is mild. Pertinent negatives include no abdominal pain, bladder incontinence, bowel incontinence, chest pain, dysuria, fever, headaches, paresis, paresthesias, tingling or weight loss. She has tried analgesics for the symptoms.    No problem-specific Assessment & Plan notes found for this encounter.   Past Medical History:  Diagnosis Date  . Cancer ([place])   . Depression     Past Surgical History:  Procedure Laterality Date  . ABDOMINAL HYSTERECTOMY    . BREAST SURGERY     2 breast ca surgeries- mastectomy     Family History  Problem Relation Age of Onset  . Cancer Mother   . Cancer Sister   . COPD Father     Social History   Socioeconomic History  . Marital status: Single    Spouse name: Not on file  . Number of children: Not on file  . Years of education: Not on file  . Highest education level: Not on file  Occupational History  . Not on file  Social Needs  . Financial resource strain: Not on file  . Food insecurity:    Worry: Not on file    Inability: Not on file  . Transportation needs:    Medical: Not on file    Non-medical: Not on file  Tobacco Use  . Smoking status: Never Smoker  . Smokeless tobacco: Never Used  Substance and Sexual Activity  . Alcohol use: Yes    Comment: rarely  . Drug use: No  . Sexual activity: Not Currently  Lifestyle  . Physical activity:    Days per week: Not on file     Minutes per session: Not on file  . Stress: Not on file  Relationships  . Social connections:    Talks on phone: Not on file    Gets together: Not on file    Attends religious service: Not on file    Active member of club or organization: Not on file    Attends meetings of clubs or organizations: Not on file    Relationship status: Not on file  . Intimate partner violence:    Fear of current or ex partner: Not on file    Emotionally abused: Not on file    Physically abused: Not on file    Forced sexual activity: Not on file  Other Topics Concern  . Not on file  Social History Narrative  . Not on file    No Known Allergies  Outpatient Medications Prior to Visit  Medication Sig Dispense Refill  . Anastrozole (ARIMIDEX PO) Take by mouth.    Marland Kitchen buPROPion (WELLBUTRIN SR) 150 MG 12 hr tablet Take 150 mg by mouth 2 (two) times daily. oncologist    . escitalopram (LEXAPRO) 20 MG tablet Take 20 mg by mouth daily.    Marland Kitchen ketorolac (TORADOL) 10 MG tablet  Take by mouth.    . ondansetron (ZOFRAN-ODT) 4 MG disintegrating tablet Take by mouth.    . oxyCODONE (OXY IR/ROXICODONE) 5 MG immediate release tablet TK 1 T PO Q 6 H PRN P FOR UP TO 3 DAYS  0   No facility-administered medications prior to visit.     Review of Systems  Constitutional: Negative for chills, fever, malaise/fatigue and weight loss.  HENT: Negative for ear discharge, ear pain and sore throat.   Eyes: Negative for blurred vision.  Respiratory: Negative for cough, sputum production, shortness of breath and wheezing.   Cardiovascular: Negative for chest pain, palpitations and leg swelling.  Gastrointestinal: Negative for abdominal pain, blood in stool, bowel incontinence, constipation, diarrhea, heartburn, melena and nausea.  Genitourinary: Positive for flank pain. Negative for bladder incontinence, dysuria, frequency, hematuria and urgency.  Musculoskeletal: Negative for back pain, joint pain, myalgias and neck pain.  Skin:  Negative for rash.  Neurological: Negative for dizziness, tingling, sensory change, focal weakness, headaches and paresthesias.  Endo/Heme/Allergies: Negative for environmental allergies and polydipsia. Does not bruise/bleed easily.  Psychiatric/Behavioral: Negative for depression and suicidal ideas. The patient is not nervous/anxious and does not have insomnia.      Objective  Vitals:   12/02/17 1623  BP: 138/70  Pulse: 72  Weight: 140 lb (63.5 kg)  Height: 5\' 4"  (1.626 m)    Physical Exam  Constitutional: She is oriented to person, place, and time. She appears well-developed and well-nourished.  HENT:  Head: Normocephalic.  Right Ear: External ear normal.  Left Ear: External ear normal.  Mouth/Throat: Oropharynx is clear and moist.  Eyes: Pupils are equal, round, and reactive to light. Conjunctivae and EOM are normal. Lids are everted and swept, no foreign bodies found. Left eye exhibits no hordeolum. No foreign body present in the left eye. Right conjunctiva is not injected. Left conjunctiva is not injected. No scleral icterus.  Neck: Normal range of motion. Neck supple. No JVD present. No tracheal deviation present. No thyromegaly present.  Cardiovascular: Normal rate, regular rhythm, normal heart sounds and intact distal pulses. Exam reveals no gallop and no friction rub.  No murmur heard. Pulmonary/Chest: Effort normal and breath sounds normal. No respiratory distress. She has no wheezes. She has no rales.  Abdominal: Soft. Bowel sounds are normal. She exhibits no mass. There is no hepatosplenomegaly. There is no tenderness. There is no rebound and no guarding.  Musculoskeletal: Normal range of motion. She exhibits no edema or tenderness.  Lymphadenopathy:    She has no cervical adenopathy.  Neurological: She is alert and oriented to person, place, and time. She has normal strength. She displays normal reflexes. No cranial nerve deficit.  Skin: Skin is warm. No rash noted.   Psychiatric: She has a normal mood and affect. Her mood appears not anxious. She does not exhibit a depressed mood.  Nursing note and vitals reviewed.     Assessment & Plan  Problem List Items Addressed This Visit    None    Visit Diagnoses    Nephrolithiasis    -  Primary   refilled Oxycodone for pain   Relevant Medications   oxyCODONE (OXY IR/ROXICODONE) 5 MG immediate release tablet      No orders of the defined types were placed in this encounter.     Dr. [facility]  12/02/17

## 2017-12-02 NOTE — Patient Instructions (Signed)
Dietary Guidelines to Help Prevent Kidney Stones Kidney stones are deposits of minerals and salts that form inside your kidneys. Your risk of developing kidney stones may be greater depending on your diet, your lifestyle, the medicines you take, and whether you have certain medical conditions. Most people can reduce their chances of developing kidney stones by following the instructions below. Depending on your overall health and the type of kidney stones you tend to develop, your dietitian may give you more specific instructions. What are tips for following this plan? Reading food labels  Choose foods with "no salt added" or "low-salt" labels. Limit your sodium intake to less than 1500 mg per day.  Choose foods with calcium for each meal and snack. Try to eat about 300 mg of calcium at each meal. Foods that contain 200-500 mg of calcium per serving include: ? 8 oz (237 ml) of milk, fortified nondairy milk, and fortified fruit juice. ? 8 oz (237 ml) of kefir, yogurt, and soy yogurt. ? 4 oz (118 ml) of tofu. ? 1 oz of cheese. ? 1 cup (300 g) of dried figs. ? 1 cup (91 g) of cooked broccoli. ? 1-3 oz can of sardines or mackerel.  Most people need 1000 to 1500 mg of calcium each day. Talk to your dietitian about how much calcium is recommended for you. Shopping  Buy plenty of fresh fruits and vegetables. Most people do not need to avoid fruits and vegetables, even if they contain nutrients that may contribute to kidney stones.  When shopping for convenience foods, choose: ? Whole pieces of fruit. ? Premade salads with dressing on the side. ? Low-fat fruit and yogurt smoothies.  Avoid buying frozen meals or prepared deli foods.  Look for foods with live cultures, such as yogurt and kefir. Cooking  Do not add salt to food when cooking. Place a salt shaker on the table and allow each person to add his or her own salt to taste.  Use vegetable protein, such as beans, textured vegetable  protein (TVP), or tofu instead of meat in pasta, casseroles, and soups. Meal planning  Eat less salt, if told by your dietitian. To do this: ? Avoid eating processed or premade food. ? Avoid eating fast food.  Eat less animal protein, including cheese, meat, poultry, or fish, if told by your dietitian. To do this: ? Limit the number of times you have meat, poultry, fish, or cheese each week. Eat a diet free of meat at least 2 days a week. ? Eat only one serving each day of meat, poultry, fish, or seafood. ? When you prepare animal protein, cut pieces into small portion sizes. For most meat and fish, one serving is about the size of one deck of cards.  Eat at least 5 servings of fresh fruits and vegetables each day. To do this: ? Keep fruits and vegetables on hand for snacks. ? Eat 1 piece of fruit or a handful of berries with breakfast. ? Have a salad and fruit at lunch. ? Have two kinds of vegetables at dinner.  Limit foods that are high in a substance called oxalate. These include: ? Spinach. ? Rhubarb. ? Beets. ? Potato chips and french fries. ? Nuts.  If you regularly take a diuretic medicine, make sure to eat at least 1-2 fruits or vegetables high in potassium each day. These include: ? Avocado. ? Banana. ? Orange, prune, carrot, or tomato juice. ? Baked potato. ? Cabbage. ? Beans and split peas.   General instructions  Drink enough fluid to keep your urine clear or pale yellow. This is the most important thing you can do.  Talk to your health care provider and dietitian about taking daily supplements. Depending on your health and the cause of your kidney stones, you may be advised: ? Not to take supplements with vitamin C. ? To take a calcium supplement. ? To take a daily probiotic supplement. ? To take other supplements such as magnesium, fish oil, or vitamin B6.  Take all medicines and supplements as told by your health care provider.  Limit alcohol intake to no more  than 1 drink a day for nonpregnant women and 2 drinks a day for men. One drink equals 12 oz of beer, 5 oz of wine, or 1 oz of hard liquor.  Lose weight if told by your health care provider. Work with your dietitian to find strategies and an eating plan that works best for you. What foods are not recommended? Limit your intake of the following foods, or as told by your dietitian. Talk to your dietitian about specific foods you should avoid based on the type of kidney stones and your overall health. Grains Breads. Bagels. Rolls. Baked goods. Salted crackers. Cereal. Pasta. Vegetables Spinach. Rhubarb. Beets. Canned vegetables. Pickles. Olives. Meats and other protein foods Nuts. Nut butters. Large portions of meat, poultry, or fish. Salted or cured meats. Deli meats. Hot dogs. Sausages. Dairy Cheese. Beverages Regular soft drinks. Regular vegetable juice. Seasonings and other foods Seasoning blends with salt. Salad dressings. Canned soups. Soy sauce. Ketchup. Barbecue sauce. Canned pasta sauce. Casseroles. Pizza. Lasagna. Frozen meals. Potato chips. French fries. Summary  You can reduce your risk of kidney stones by making changes to your diet.  The most important thing you can do is drink enough fluid. You should drink enough fluid to keep your urine clear or pale yellow.  Ask your health care provider or dietitian how much protein from animal sources you should eat each day, and also how much salt and calcium you should have each day. This information is not intended to replace advice given to you by your health care provider. Make sure you discuss any questions you have with your health care provider. Document Released: 09/15/2010 Document Revised: 05/01/2016 Document Reviewed: 05/01/2016 Elsevier Interactive Patient Education  2018 Elsevier Inc.  

## 2017-12-03 NOTE — Addendum Note (Signed)
Addended by: Fredderick Severance on: 12/03/2017 10:51 AM   Modules accepted: Orders

## 2017-12-03 NOTE — Progress Notes (Signed)
12/04/2017 2:22 PM   Morgan Bennett [birth year] [id number]  Referring provider: Juline Patch, MD [address] [place], [place] [address]  Chief Complaint  Patient presents with  . Nephrolithiasis    HPI: Patient is a 62 year old Caucasian female who is referred by Dr. Juline Bennett for nephrolithiasis with her sister, Morgan Bennett.    She was first seen on 11/26/2017 at [facility] for right flan pain with vomiting.  CT scan 0.4 cm obstructing calculus in the proximal/mid right ureter with associated mild right hydroureteronephrosis. Slightly delayed right nephrogram likely in setting of urinary tract obstruction.  She then returned on 11/28/2017 with the same complaints.  RUS notes no hydronephrosis. No nephrolithiasis. The ureters are not well-visualized.  Underdistended bladder which limits evaluation.  Today, she is still having intermittent right flank pain that radiates to the right waist.  Pain 8/10.  she is complaining of nocturia x 2-3.  Patient denies any gross hematuria, dysuria or suprapubic/flank pain.  Patient denies any fevers, chills, nausea or vomiting.  Her UA is negative.    KUB on 12/04/2017 noted a 4 mm stone just below the pelvic brim on the right.    PMH: Past Medical History:  Diagnosis Date  . Cancer ([place])   . Depression     Surgical History: Past Surgical History:  Procedure Laterality Date  . ABDOMINAL HYSTERECTOMY    . BREAST SURGERY     2 breast ca surgeries- mastectomy     Home Medications:  Allergies as of 12/04/2017   No Known Allergies     Medication List        Accurate as of 12/04/17  2:22 PM. Always use your most recent med list.          ARIMIDEX PO Take by mouth.   buPROPion 150 MG 12 hr tablet Commonly known as:  WELLBUTRIN SR Take 150 mg by mouth 2 (two) times daily. oncologist   escitalopram 20 MG tablet Commonly known as:  LEXAPRO Take 20 mg by mouth daily.   ketorolac 10 MG tablet Commonly known as:  TORADOL Take  10 mg by mouth every 6 (six) hours as needed.   ondansetron 4 MG disintegrating tablet Commonly known as:  ZOFRAN-ODT Take by mouth.   oxyCODONE 5 MG immediate release tablet Commonly known as:  Oxy IR/ROXICODONE TK 1 T PO Q 6 H PRN P FOR UP TO 3 DAYS   tamsulosin 0.4 MG Caps capsule Commonly known as:  FLOMAX Take 1 capsule (0.4 mg total) by mouth daily.       Allergies: No Known Allergies  Family History: Family History  Problem Relation Age of Onset  . Cancer Mother   . Cancer Sister   . COPD Father     Social History:  reports that she has never smoked. She has never used smokeless tobacco. She reports that she drinks alcohol. She reports that she does not use drugs.  ROS: UROLOGY Frequent Urination?: No Hard to postpone urination?: No Burning/pain with urination?: No Get up at night to urinate?: Yes Leakage of urine?: No Urine stream starts and stops?: No Trouble starting stream?: No Do you have to strain to urinate?: No Blood in urine?: No Urinary tract infection?: No Sexually transmitted disease?: No Injury to kidneys or bladder?: No Painful intercourse?: No Weak stream?: No Currently pregnant?: No Vaginal bleeding?: No Last menstrual period?: n  Gastrointestinal Nausea?: Yes Vomiting?: No Indigestion/heartburn?: No Diarrhea?: No Constipation?: Yes  Constitutional  Fever: No Night sweats?: No Weight loss?: No Fatigue?: Yes  Skin Skin rash/lesions?: No Itching?: No  Eyes Blurred vision?: No Double vision?: No  Ears/Nose/Throat Sore throat?: No Sinus problems?: No  Hematologic/Lymphatic Swollen glands?: No Easy bruising?: Yes  Cardiovascular Leg swelling?: No Chest pain?: No  Respiratory Cough?: No Shortness of breath?: No  Endocrine Excessive thirst?: No  Musculoskeletal Back pain?: Yes Joint pain?: No  Neurological Headaches?: Yes Dizziness?: No  Psychologic Depression?: No Anxiety?: Yes  Physical Exam: BP  120/71 (BP Location: Right Arm, Patient Position: Sitting, Cuff Size: Normal)   Pulse 86   Ht 5' 4" (1.626 m)   Wt 137 lb 12.8 oz (62.5 kg)   BMI 23.65 kg/m   Constitutional:  Well nourished. Alert and oriented, No acute distress. HEENT: [place] AT, moist mucus membranes.  Trachea midline, no masses. Cardiovascular: No clubbing, cyanosis, or edema. Respiratory: Normal respiratory effort, no increased work of breathing. GI: Abdomen is soft, non tender, non distended, no abdominal masses. Liver and spleen not palpable.  No hernias appreciated.  Stool sample for occult testing is not indicated.   GU: No CVA tenderness.  No bladder fullness or masses.   Skin: No rashes, bruises or suspicious lesions. Lymph: No cervical or inguinal adenopathy. Neurologic: Grossly intact, no focal deficits, moving all 4 extremities. Psychiatric: Normal mood and affect.  Laboratory Data: Lab Results  Component Value Date   WBC 7.0 11/26/2017   HGB 13.1 11/26/2017   HCT 38.4 11/26/2017   MCV 90.8 11/26/2017   PLT 282 11/26/2017    Lab Results  Component Value Date   CREATININE 0.74 11/26/2017    No results found for: PSA  No results found for: TESTOSTERONE  No results found for: HGBA1C  No results found for: TSH  No results found for: CHOL, HDL, CHOLHDL, VLDL, LDLCALC  Lab Results  Component Value Date   AST 22 11/26/2017   Lab Results  Component Value Date   ALT 14 11/26/2017   No components found for: ALKALINEPHOPHATASE No components found for: BILIRUBINTOTAL  No results found for: ESTRADIOL  Urinalysis Negative. See Epic.   I have reviewed the labs.   Pertinent Imaging: - No hydronephrosis. - No nephrolithiasis. The ureters are not well-visualized. - Underdistended bladder which limits evaluation.  Result Narrative  EXAM: US RENAL COMPLETE DATE: 11/28/2017 6:34 PM ACCESSION: [id number] UN DICTATED: 11/28/2017 6:35 PM INTERPRETATION LOCATION: [facility]  CLINICAL  INDICATION: 62 years old Female with known R sided stone with hydro  COMPARISON: CT abdomen pelvis 11/26/2017.  TECHNIQUE: Static and cine images of the kidneys and bladder were performed.  FINDINGS:  KIDNEYS: The right kidney measures 11.0 cm and the left measures 9.9 cm. The kidneys are normal in size and echogenicity. No hydronephrosis. No nephrolithiasis or solid renal masses appreciated. The ureters are not well visualized. No perinephric free fluid appreciated.  BLADDER: The bladder is decompressed which limits evaluation. Calculated bladder volume of 33.9 mL.    Result Impression  - 0.4 cm obstructing calculus in the proximal/mid right ureter with associated mild right hydroureteronephrosis. Slightly delayed right nephrogram likely in setting of urinary tract obstruction.  Result Narrative  EXAM: CT ABDOMEN PELVIS W CONTRAST DATE: 11/26/2017 5:10 PM ACCESSION: [id number] UN DICTATED: 11/26/2017 5:17 PM INTERPRETATION LOCATION: [facility]  CLINICAL INDICATION: RLQ pain  COMPARISON: None  TECHNIQUE: A spiral CT scan of the abdomen and pelvis was obtained with IV contrast from the lung bases through the pubic symphysis. Images were reconstructed in the  axial plane. Coronal and sagittal reformatted images were also provided for further evaluation.  FINDINGS:   LINES AND TUBES: None.  LOWER THORAX: Normal.  HEPATOBILIARY: Punctate calcification in the left hepatic lobe, likely calcified granuloma. Focal fatty infiltration along the falciform ligament. The gallbladder is present and otherwise unremarkable. No biliary dilatation. SPLEEN: Multiple punctate calcifications in the spleen, likely calcified granulomas. PANCREAS: Unremarkable. ADRENALS: Unremarkable. KIDNEYS/URETERS: Slightly delayed right nephrogram. There is a 0.4 cm calculus in the proximal /mid right ureter (2:76). There is mild right hydroureteronephrosis. No left hydronephrosis.  BLADDER: Bladder is  underdistended which limits evaluation. PELVIC/REPRODUCTIVE ORGANS: The uterus is surgically absent.  GI TRACT: No dilated or thick walled loops of bowel. Normal appendix. No evidence of bowel obstruction. PERITONEUM/RETROPERITONEUM AND MESENTERY: No free air or fluid. LYMPH NODES: No enlarged lymph nodes.  VESSELS: The aorta is normal in caliber.No significant calcified atherosclerotic disease. The portal venous system is patent. The hepatic veins and IVC are unremarkable.  BONES AND SOFT TISSUES: Multilevel degenerative changes in the spine most pronounced at L4-5. No suspicious osseous lesions.   CLINICAL DATA:  Right side abdominal pain.  Known kidney stone  EXAM: ABDOMEN - 1 VIEW  COMPARISON:  None.  FINDINGS: Moderate stool burden in the colon. No evidence of bowel obstruction. No organomegaly. Rounded calcification in the right side of the pelvis. This could reflect phleboliths or distal right ureteral stone. No acute bony abnormality.  IMPRESSION: 4 mm calcification in the right pelvis could reflect phleboliths or distal right ureteral stone. Recommend correlation for flank pain and hematuria.  Moderate stool burden.   Electronically Signed   By: Rolm Baptise M.D.   On: 12/04/2017 17:23  I have independently reviewed the KUB  Assessment & Plan:    1. Right ureteral stone explained to the patient that AUA Guidelines for patients with uncomplicated ureteral stones ?10 mm should be offered observation, and those with distal stones of similar size should be offered MET with ?-blockers - tamsulosin prescription given URS would be the first lined therapy if stone(s) do not pass, but ESWL is the procedure with the least morbidity and lowest complication rate, but URS has a greater stone-free rate in a single procedure She would like to have right ESWL as she is still having episodes of intense flank pain - refill on oxycodone is given  I explained that ESWL is a  means of pulverizing urinary stones without surgery using shockwave therapy I discussed the risks involved with ESWL consist of bruising to the skin and kidney region as a result of the shockwave, possibility of long-term kidney damage, development of high blood pressure and damage to the bowel or long, hematuria, urinary bleeding serious enough to require transfusion or surgical repair or removal of the kidney is rare, rare chance of hematoma formation in the kidney and injuries to the spleen, liver or pancreas.  There is also the risk of urinary tract infection or any infection of the blood system or tissue.   There is also a possibility of resultant damage to female organs. I explained that sometimes the stone fragments can stack up in the ureter like coins, a phenomenon described as "Steinstrasse", that would result in a stent placement and/or URS for further treatment I informed the patient that IV sedation is typically used on the truck, but it some rare instances we need to use general anesthesia - the risks being infection, irregular heart beat, irregular BP, stroke, MI, CVA, paralysis, coma and/or death.  KUB today shows a stone just below the pelvic brim on the right  Refill on oxycodone is given  Controlled Substance Prescriptions [place] Controlled Substance Registry consulted? Yes, I have consulted the [place] Controlled Substances Registry for this patient, and feel the risk/benefit ratio today is favorable for proceeding with this prescription for a controlled substance. Patient was prescribed oxycodone 5 mg IR # 10 11/26/2017, oxycodone 5 mg IR # 10 11/29/2017  2. Right hydronephrosis obtain RUS to ensure the hydronephrosis has resolved once they have passed and/or recovered from procedure to ensure to iatrogenic hydronephrosis remains - it is explained to the patient that it is important to document resolution of the hydronephrosis as "silent hydronephrosis" can occur and cause damage and/or loss of  the kidney   Return for right ESWL .  These notes generated with voice recognition software. I apologize for typographical errors.  Zara Council, PA-C  [facility] [address]  [place], [place] [address] [phone]

## 2017-12-04 ENCOUNTER — Ambulatory Visit
Admission: RE | Admit: 2017-12-04 | Discharge: 2017-12-04 | Disposition: A | Discharge status: Home / Self Care | Payer: BC Managed Care – PPO | Source: Ambulatory Visit | Attending: Urology | Admitting: Urology

## 2017-12-04 ENCOUNTER — Ambulatory Visit: Payer: BC Managed Care – PPO | Admitting: Urology

## 2017-12-04 ENCOUNTER — Other Ambulatory Visit: Payer: Self-pay | Admitting: Radiology

## 2017-12-04 ENCOUNTER — Encounter: Payer: Self-pay | Admitting: Urology

## 2017-12-04 VITALS — BP 120/71 | HR 86 | Ht 64.0 in | Wt 137.8 lb

## 2017-12-04 DIAGNOSIS — N201 Calculus of ureter: Secondary | ICD-10-CM

## 2017-12-04 DIAGNOSIS — R319 Hematuria, unspecified: Secondary | ICD-10-CM | POA: Insufficient documentation

## 2017-12-04 DIAGNOSIS — N132 Hydronephrosis with renal and ureteral calculous obstruction: Secondary | ICD-10-CM

## 2017-12-04 DIAGNOSIS — R109 Unspecified abdominal pain: Secondary | ICD-10-CM | POA: Insufficient documentation

## 2017-12-04 LAB — URINALYSIS, COMPLETE
BILIRUBIN UA: NEGATIVE
Glucose, UA: NEGATIVE
KETONES UA: NEGATIVE
Leukocytes, UA: NEGATIVE
NITRITE UA: NEGATIVE
Protein, UA: NEGATIVE
RBC, UA: NEGATIVE
SPEC GRAV UA: 1.01 (ref 1.005–1.030)
Urobilinogen, Ur: 0.2 mg/dL (ref 0.2–1.0)
pH, UA: 6.5 (ref 5.0–7.5)

## 2017-12-04 LAB — MICROSCOPIC EXAMINATION: EPITHELIAL CELLS (NON RENAL): NONE SEEN /HPF (ref 0–10)

## 2017-12-04 MED ORDER — OXYCODONE HCL 5 MG PO TABS: ORAL_TABLET | ORAL | 0 refills | Status: AC

## 2017-12-04 MED ORDER — TAMSULOSIN HCL 0.4 MG PO CAPS: 0.4000 mg | ORAL_CAPSULE | Freq: Every day | ORAL | 0 refills | Status: DC

## 2017-12-07 LAB — CULTURE, URINE COMPREHENSIVE

## 2017-12-11 ENCOUNTER — Ambulatory Visit
Admission: RE | Admit: 2017-12-11 | Discharge: 2017-12-11 | Disposition: A | Discharge status: Home / Self Care | Payer: BC Managed Care – PPO | Source: Ambulatory Visit | Attending: Urology | Admitting: Urology

## 2017-12-11 ENCOUNTER — Telehealth: Payer: Self-pay | Admitting: Radiology

## 2017-12-11 ENCOUNTER — Other Ambulatory Visit: Payer: Self-pay | Admitting: Radiology

## 2017-12-11 DIAGNOSIS — N201 Calculus of ureter: Secondary | ICD-10-CM | POA: Insufficient documentation

## 2017-12-11 NOTE — Telephone Encounter (Signed)
Patient called earlier today stating she had passed the kidney stone. Advised patient to bring stone to the office for analysis. Patient voiced understanding. LMOM to notify patient that Dr Bernardo Heater recommends KUB prior to cancelling ESWL scheduled on 12/12/2017.

## 2017-12-12 ENCOUNTER — Encounter: Admission: RE | Payer: Self-pay | Source: Ambulatory Visit

## 2017-12-12 ENCOUNTER — Ambulatory Visit: Admission: RE | Admit: 2017-12-12 | Payer: BC Managed Care – PPO | Source: Ambulatory Visit | Admitting: Urology

## 2017-12-12 SURGERY — LITHOTRIPSY, ESWL
Anesthesia: Moderate Sedation | Laterality: Right

## 2017-12-16 ENCOUNTER — Telehealth: Payer: Self-pay

## 2017-12-16 DIAGNOSIS — N132 Hydronephrosis with renal and ureteral calculous obstruction: Secondary | ICD-10-CM

## 2017-12-16 DIAGNOSIS — N201 Calculus of ureter: Secondary | ICD-10-CM

## 2017-12-16 NOTE — Telephone Encounter (Signed)
Order placed for u/s please schedule  thanks

## 2017-12-16 NOTE — Telephone Encounter (Signed)
-----   Message from Nori Riis, PA-C sent at 12/15/2017  1:57 PM EDT ----- Patient will need to have a RUS prior to her 08/15 appointment.

## 2017-12-17 NOTE — Telephone Encounter (Signed)
email sent to scheduling to call the patient   Morgan Bennett

## 2017-12-24 ENCOUNTER — Other Ambulatory Visit: Payer: Self-pay | Admitting: Urology

## 2017-12-31 ENCOUNTER — Ambulatory Visit: Payer: BC Managed Care – PPO | Admitting: Urology

## 2018-01-02 ENCOUNTER — Other Ambulatory Visit: Payer: Self-pay | Admitting: Urology

## 2018-01-03 ENCOUNTER — Ambulatory Visit
Admission: RE | Admit: 2018-01-03 | Discharge: 2018-01-03 | Disposition: A | Discharge status: Home / Self Care | Payer: BC Managed Care – PPO | Source: Ambulatory Visit | Attending: Urology | Admitting: Urology

## 2018-01-03 DIAGNOSIS — N132 Hydronephrosis with renal and ureteral calculous obstruction: Secondary | ICD-10-CM | POA: Diagnosis present

## 2018-01-12 NOTE — Progress Notes (Deleted)
01/13/2018 7:48 PM   Morgan Bennett 1956/03/03 [id number]  Referring provider: Juline Patch, MD [address] [place], [place] [address]  No chief complaint on file.   HPI: Patient is a 62 year old Caucasian female with a history of right ureteral stone who presents today for follow up.  Background history Patient was referred by Dr. Juline Patch for nephrolithiasis with her sister, [name].  She was first seen on 11/26/2017 at [facility] for right flan pain with vomiting.  CT scan 0.4 cm obstructing calculus in the proximal/mid right ureter with associated mild right hydroureteronephrosis. Slightly delayed right nephrogram likely in setting of urinary tract obstruction.  She then returned on 11/28/2017 with the same complaints.  RUS notes no hydronephrosis. No nephrolithiasis. The ureters are not well-visualized.  Underdistended bladder which limits evaluation.  KUB on 12/04/2017 noted a 4 mm stone just below the pelvic brim on the right.    She was scheduled for ESWL, but she subsequently passed the stone.    KUB on 12/11/2017 noted calcifications seen in right pelvis on prior exam is not well visualized currently. No definite nephrolithiasis is noted. No definite evidence of bowel obstruction or ileus.  RUS on 01/03/2018 noted normal ultrasound appearance of both kidneys and the urinary bladder.  Patent steatosis.  Today, ***.   Patient denies any gross hematuria, dysuria or suprapubic/flank pain.  Patient denies any fevers, chills, nausea or vomiting.   PMH: Past Medical History:  Diagnosis Date  . Cancer ([place])   . Depression     Surgical History: Past Surgical History:  Procedure Laterality Date  . ABDOMINAL HYSTERECTOMY    . BREAST SURGERY     2 breast ca surgeries- mastectomy     Home Medications:  Allergies as of 01/13/2018   No Known Allergies     Medication List        Accurate as of 01/12/18  7:48 PM. Always use your most recent med list.          ARIMIDEX PO Take by mouth.   buPROPion 150 MG 12 hr tablet Commonly known as:  WELLBUTRIN SR Take 150 mg by mouth 2 (two) times daily. oncologist   escitalopram 20 MG tablet Commonly known as:  LEXAPRO Take 20 mg by mouth daily.   ketorolac 10 MG tablet Commonly known as:  TORADOL Take 10 mg by mouth every 6 (six) hours as needed.   oxyCODONE 5 MG immediate release tablet Commonly known as:  Oxy IR/ROXICODONE TK 1 T PO Q 6 H PRN P FOR UP TO 3 DAYS   tamsulosin 0.4 MG Caps capsule Commonly known as:  FLOMAX TAKE 1 CAPSULE(0.4 MG) BY MOUTH DAILY       Allergies: No Known Allergies  Family History: Family History  Problem Relation Age of Onset  . Cancer Mother   . Cancer Sister   . COPD Father     Social History:  reports that she has never smoked. She has never used smokeless tobacco. She reports that she drinks alcohol. She reports that she does not use drugs.  ROS:                                        Physical Exam: There were no vitals taken for this visit.  Constitutional: Well nourished. Alert and oriented, No acute distress. HEENT: [place] AT, moist mucus membranes.  Trachea midline, no masses. Cardiovascular: No clubbing, cyanosis, or edema. Respiratory: Normal respiratory effort, no increased work of breathing. Skin: No rashes, bruises or suspicious lesions. Lymph: No cervical or inguinal adenopathy. Neurologic: Grossly intact, no focal deficits, moving all 4 extremities. Psychiatric: Normal mood and affect.  Laboratory Data: Lab Results  Component Value Date   WBC 7.0 11/26/2017   HGB 13.1 11/26/2017   HCT 38.4 11/26/2017   MCV 90.8 11/26/2017   PLT 282 11/26/2017    Lab Results  Component Value Date   CREATININE 0.74 11/26/2017    No results found for: PSA  No results found for: TESTOSTERONE  No results found for: HGBA1C  No results found for: TSH  No results found for: CHOL, HDL, CHOLHDL, VLDL, LDLCALC  Lab  Results  Component Value Date   AST 22 11/26/2017   Lab Results  Component Value Date   ALT 14 11/26/2017   No components found for: ALKALINEPHOPHATASE No components found for: BILIRUBINTOTAL  No results found for: ESTRADIOL  I have reviewed the labs.   Pertinent Imaging: CLINICAL DATA:  62 year old female with nephrolithiasis, hydronephrosis.  EXAM: RENAL / URINARY TRACT ULTRASOUND COMPLETE  COMPARISON:  KUB 12/11/2017, 12/04/2017.  FINDINGS: Right Kidney:  Length: 10.2 centimeters. Echogenicity within normal limits. No mass or hydronephrosis.  Left Kidney:  Length: 10.0 centimeters. Echogenicity within normal limits. No mass or hydronephrosis.  Bladder:  Appears normal for degree of bladder distention. Both ureteral jets are seen with Doppler.  Other findings: Echogenic liver (image 2).  IMPRESSION: 1. Normal ultrasound appearance of both kidneys and the urinary bladder. 2. Patent steatosis.   Electronically Signed   By: Genevie Ann M.D.   On: 01/03/2018 15:33 I have independently reviewed the films  Assessment & Plan:    1. Right ureteral stone Spontaneously passed  2. Right hydronephrosis Resolved    No follow-ups on file.  These notes generated with voice recognition software. I apologize for typographical errors.  Zara Council, PA-C  [facility] [address]  [place], [place] [address] [phone]

## 2018-01-13 ENCOUNTER — Ambulatory Visit: Payer: BC Managed Care – PPO | Admitting: Urology

## 2018-01-17 ENCOUNTER — Ambulatory Visit (INDEPENDENT_AMBULATORY_CARE_PROVIDER_SITE_OTHER): Payer: BC Managed Care – PPO

## 2018-01-17 DIAGNOSIS — Z23 Encounter for immunization: Secondary | ICD-10-CM | POA: Diagnosis not present

## 2018-02-09 ENCOUNTER — Telehealth: Payer: Self-pay | Admitting: Urology

## 2018-07-14 ENCOUNTER — Encounter: Payer: Self-pay | Admitting: Family Medicine

## 2018-07-14 ENCOUNTER — Encounter: Payer: BC Managed Care – PPO | Admitting: Family Medicine

## 2018-07-14 NOTE — Progress Notes (Signed)
Pt not seen- decided to go to Dermatology

## 2018-07-21 ENCOUNTER — Encounter: Payer: Self-pay | Admitting: Family Medicine

## 2018-08-13 IMAGING — CR DG RIBS 2V*L*
4 series · 4 of 4 positions shown · non-contrast
Comparison: None.

CLINICAL DATA: Fever for several months.  Left anterior chest pain.

EXAM:
LEFT RIBS - 2 VIEW

[rib pa (1 of 2)]
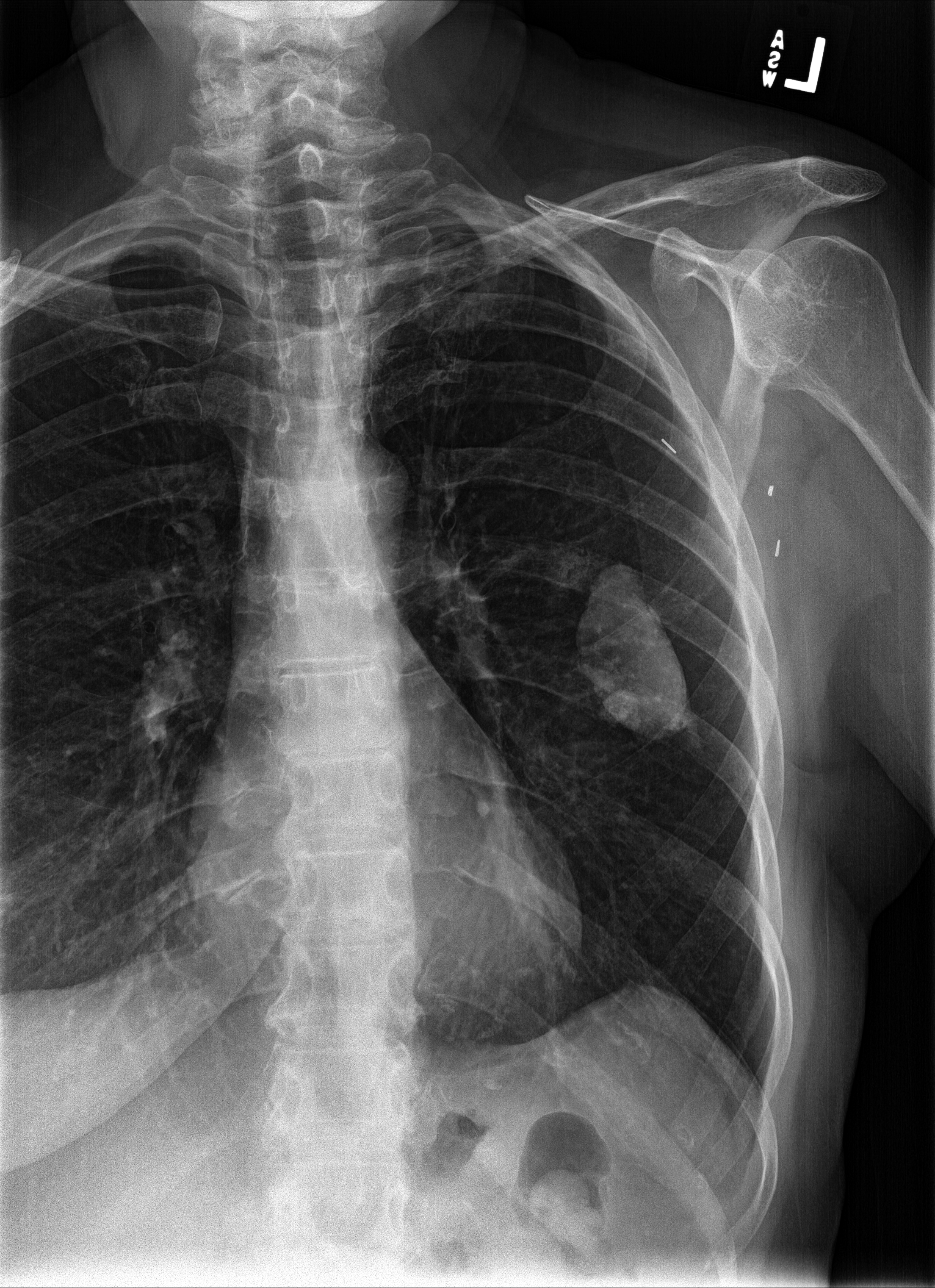

[rib pa (2 of 2)]
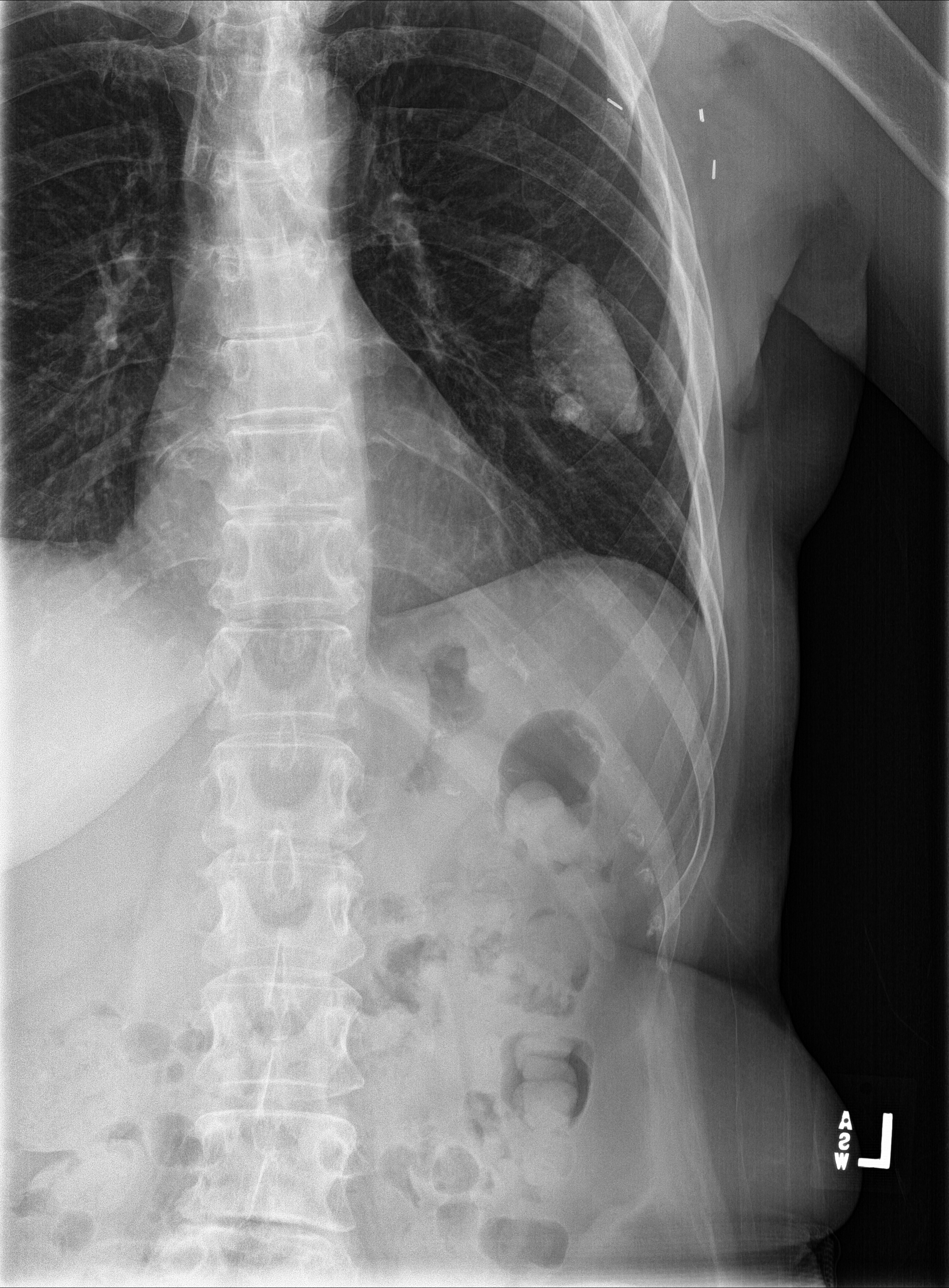

[rib obl (1 of 2)]
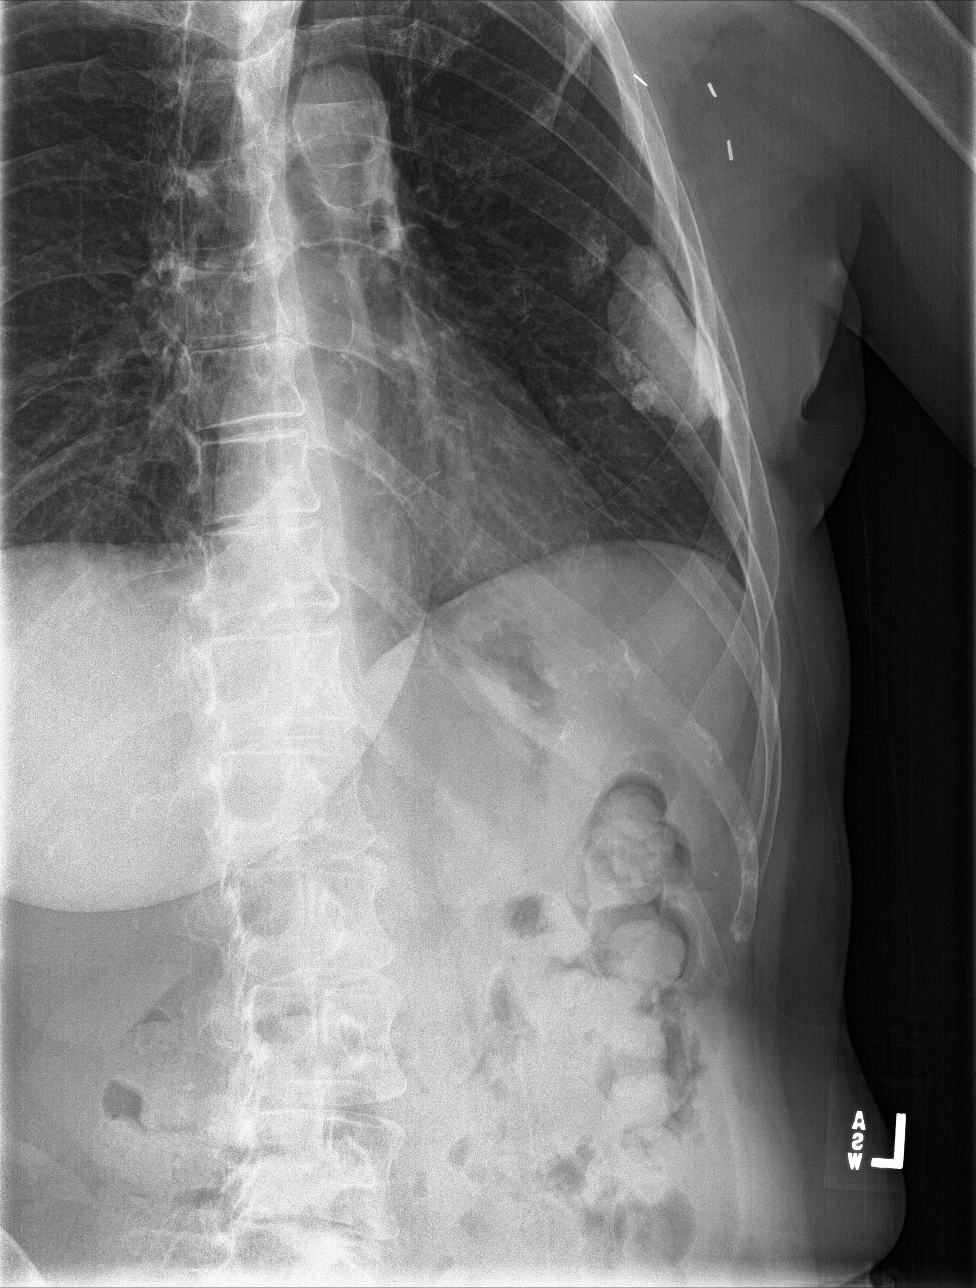

[rib obl (2 of 2)]
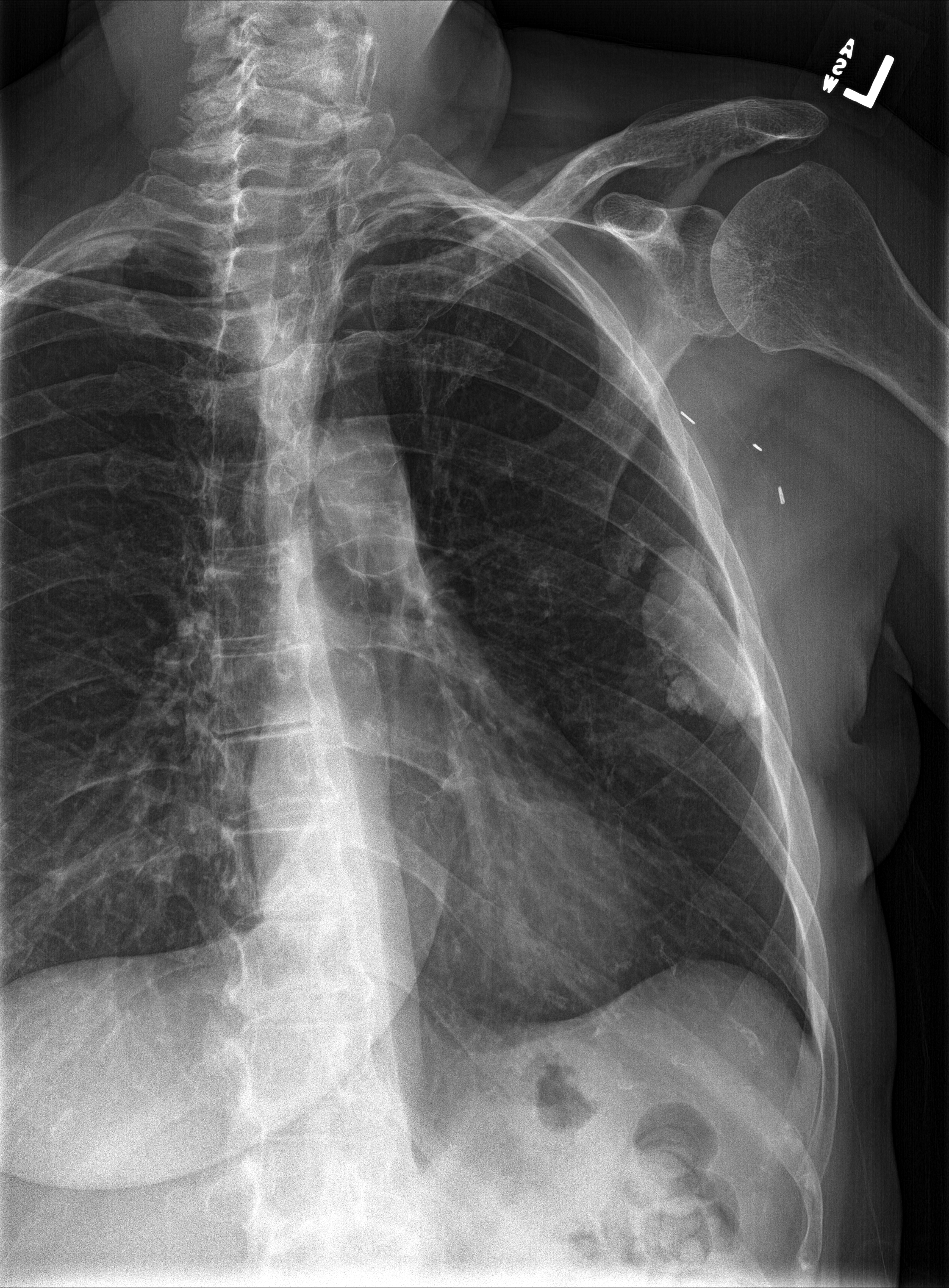

[4 of 4 positions shown; findings below may reference images not displayed]

FINDINGS: Mild increased density in the left apex, asymmetric to the right.
Calcified mass in the left mid lung. Adjacent nodularity may be but
is not definitely calcified as well.

No rib fractures or bony lesions identified.
IMPRESSION: 1. Lung findings as above. See the chest x-ray report and
recommendations.
2. No bony lesions identified.

## 2018-09-17 IMAGING — CR DG ABDOMEN 1V
1 series · 2 of 2 positions shown · non-contrast
Comparison: None.

CLINICAL DATA: Right side abdominal pain.  Known kidney stone

EXAM:
ABDOMEN - 1 VIEW

[Series 1: dg abd 1 view · 0.14mm/px · 2 of 2 slices shown]
[im 1/2]
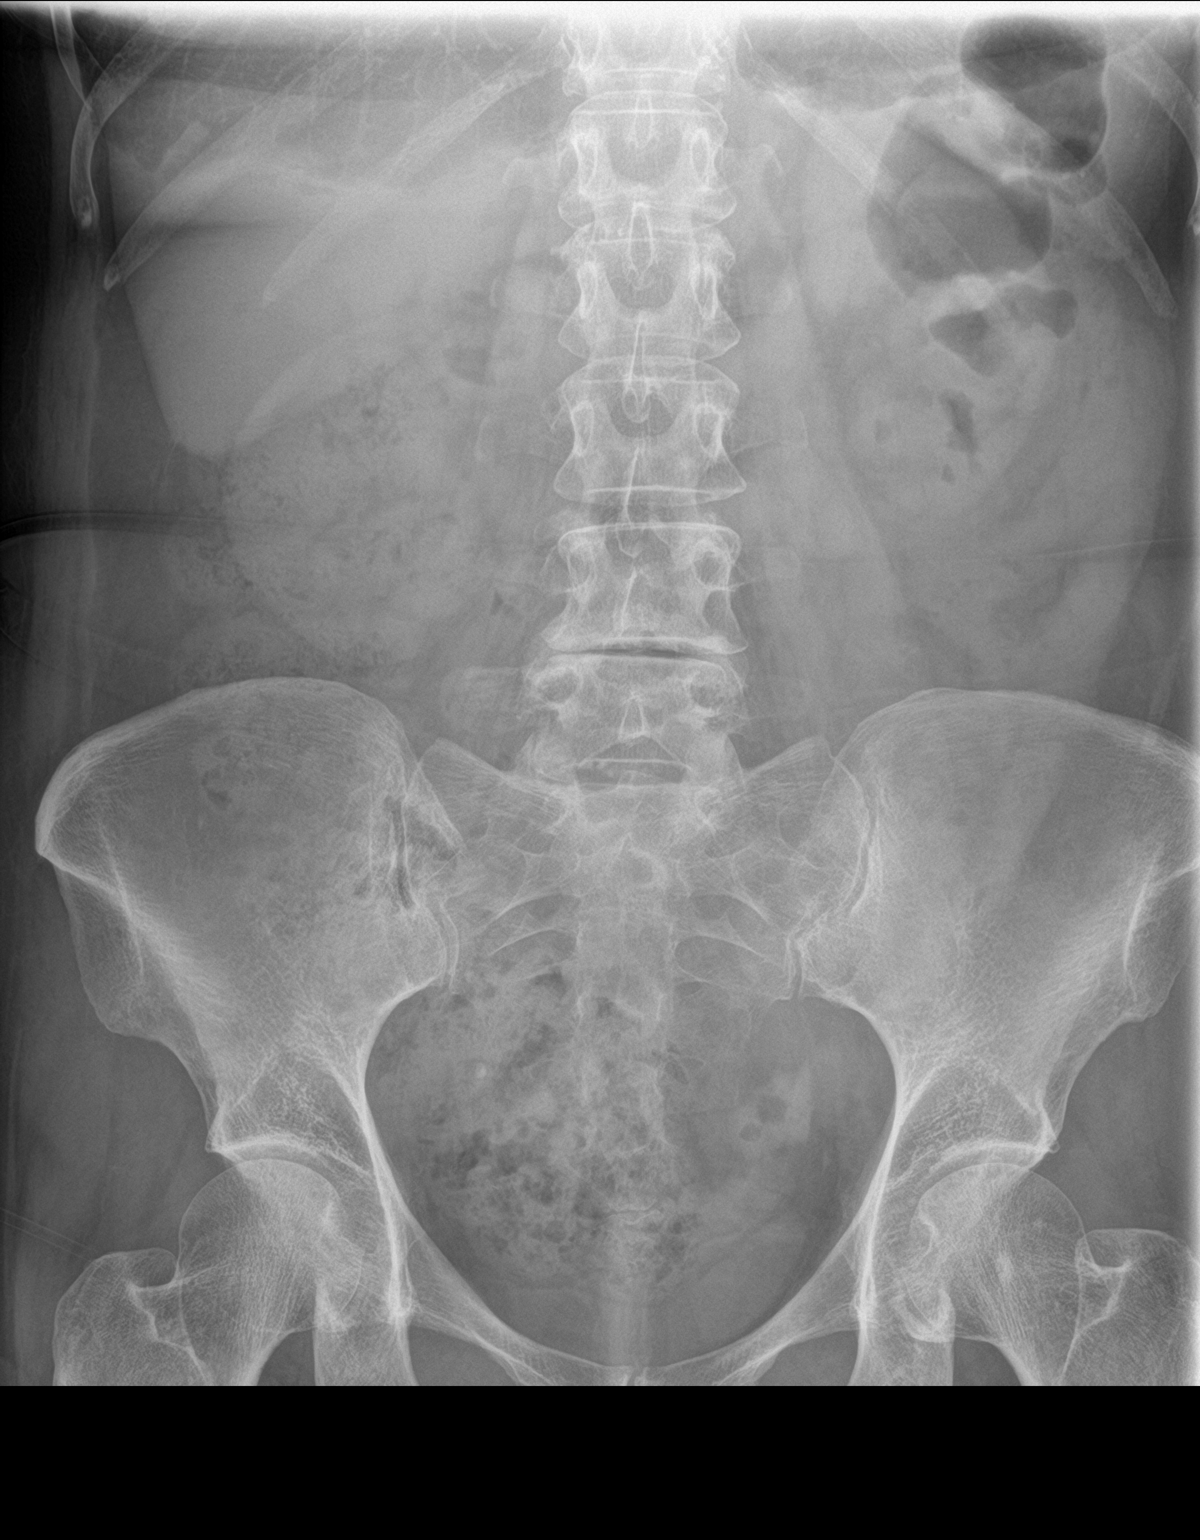
[im 2/2]
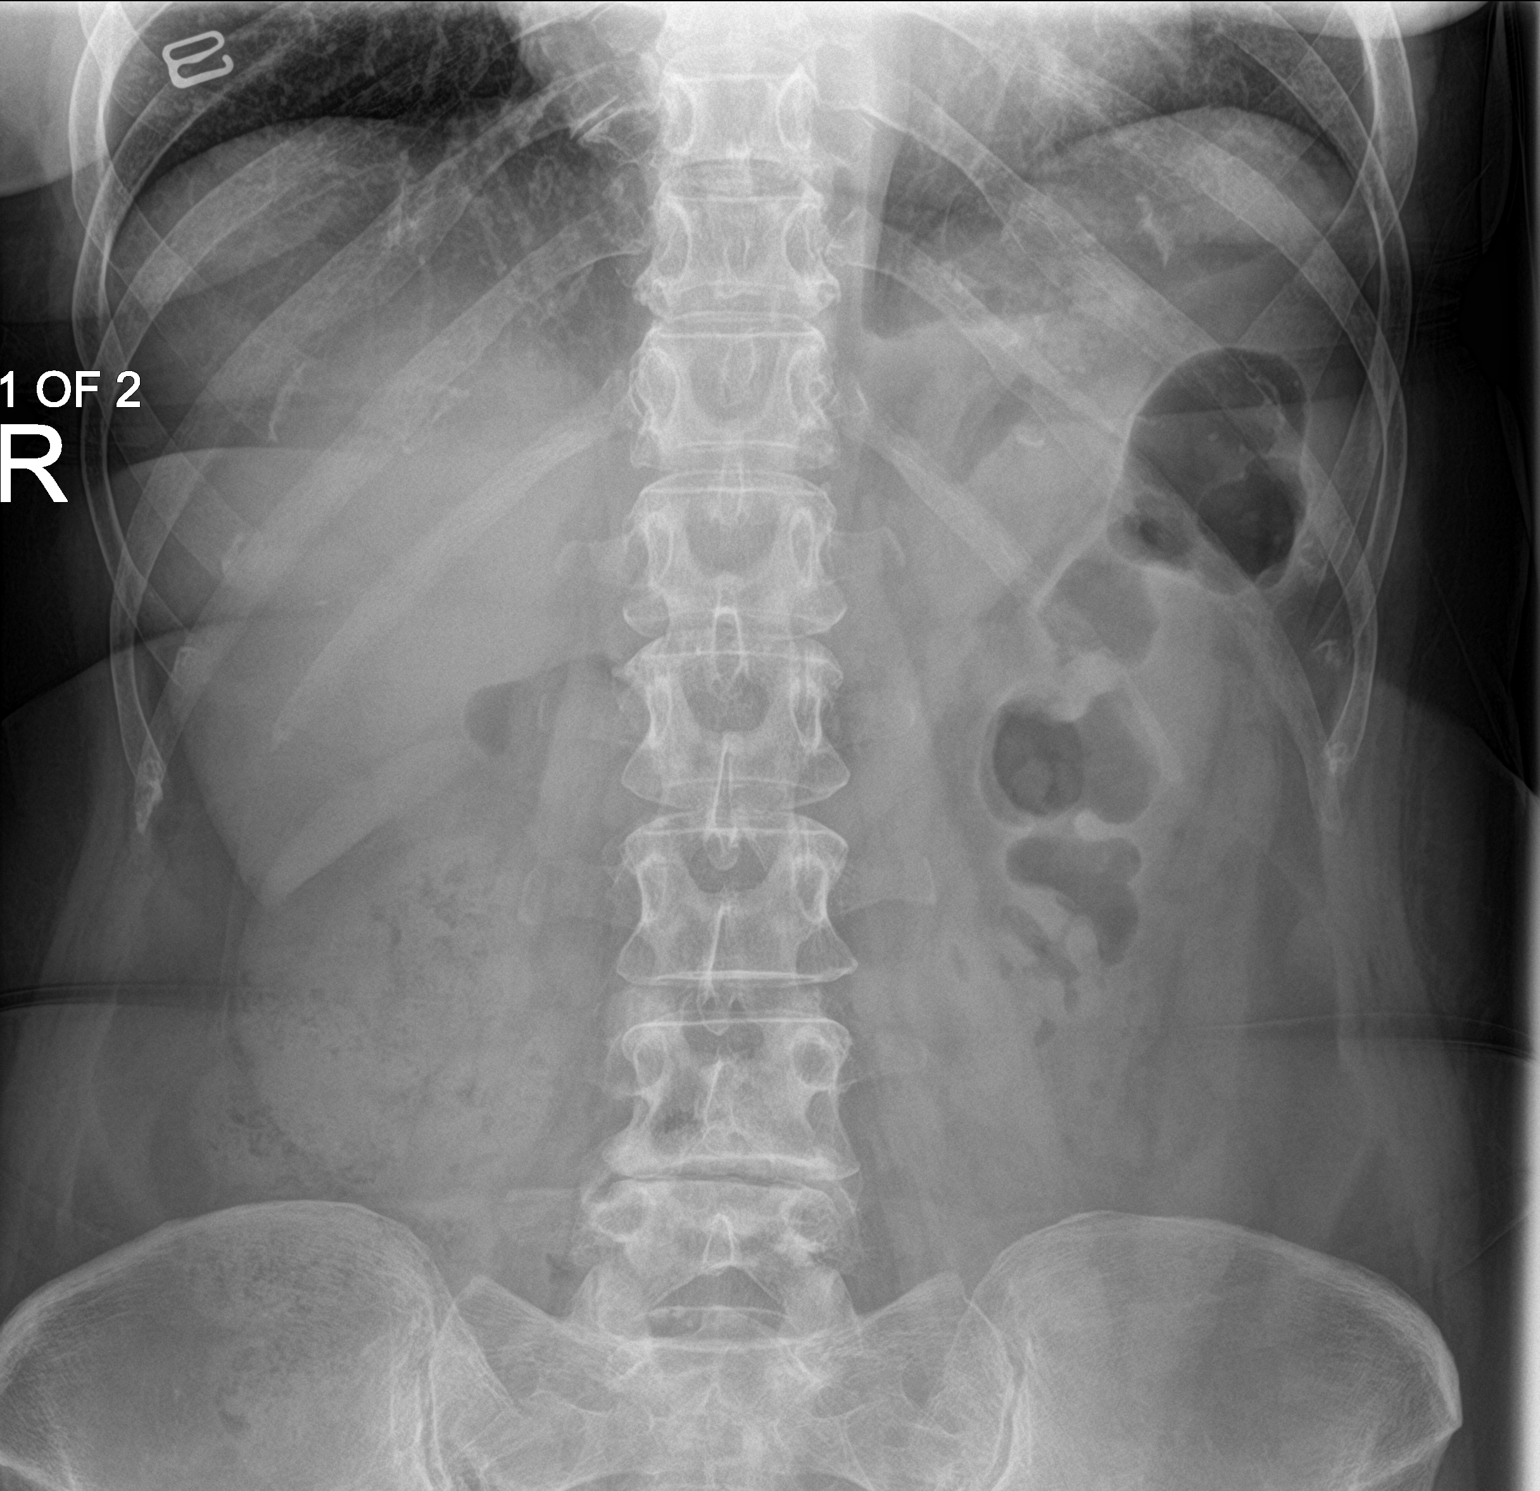

[2 of 2 positions shown; findings below may reference images not displayed]

FINDINGS: Moderate stool burden in the colon. No evidence of bowel
obstruction. No organomegaly. Rounded calcification in the right
side of the pelvis. This could reflect phleboliths or distal right
ureteral stone. No acute bony abnormality.
IMPRESSION: 4 mm calcification in the right pelvis could reflect phleboliths or
distal right ureteral stone. Recommend correlation for flank pain
and hematuria.

Moderate stool burden.

## 2018-09-24 IMAGING — CR DG ABDOMEN 1V
1 series · 2 of 2 positions shown · non-contrast
Comparison: Radiographs December 04, 2017.

CLINICAL DATA: Right ureteral stone.

EXAM:
ABDOMEN - 1 VIEW

[Series 1: dg abd 1 view · 0.14mm/px · 2 of 2 slices shown]
[im 1/2]
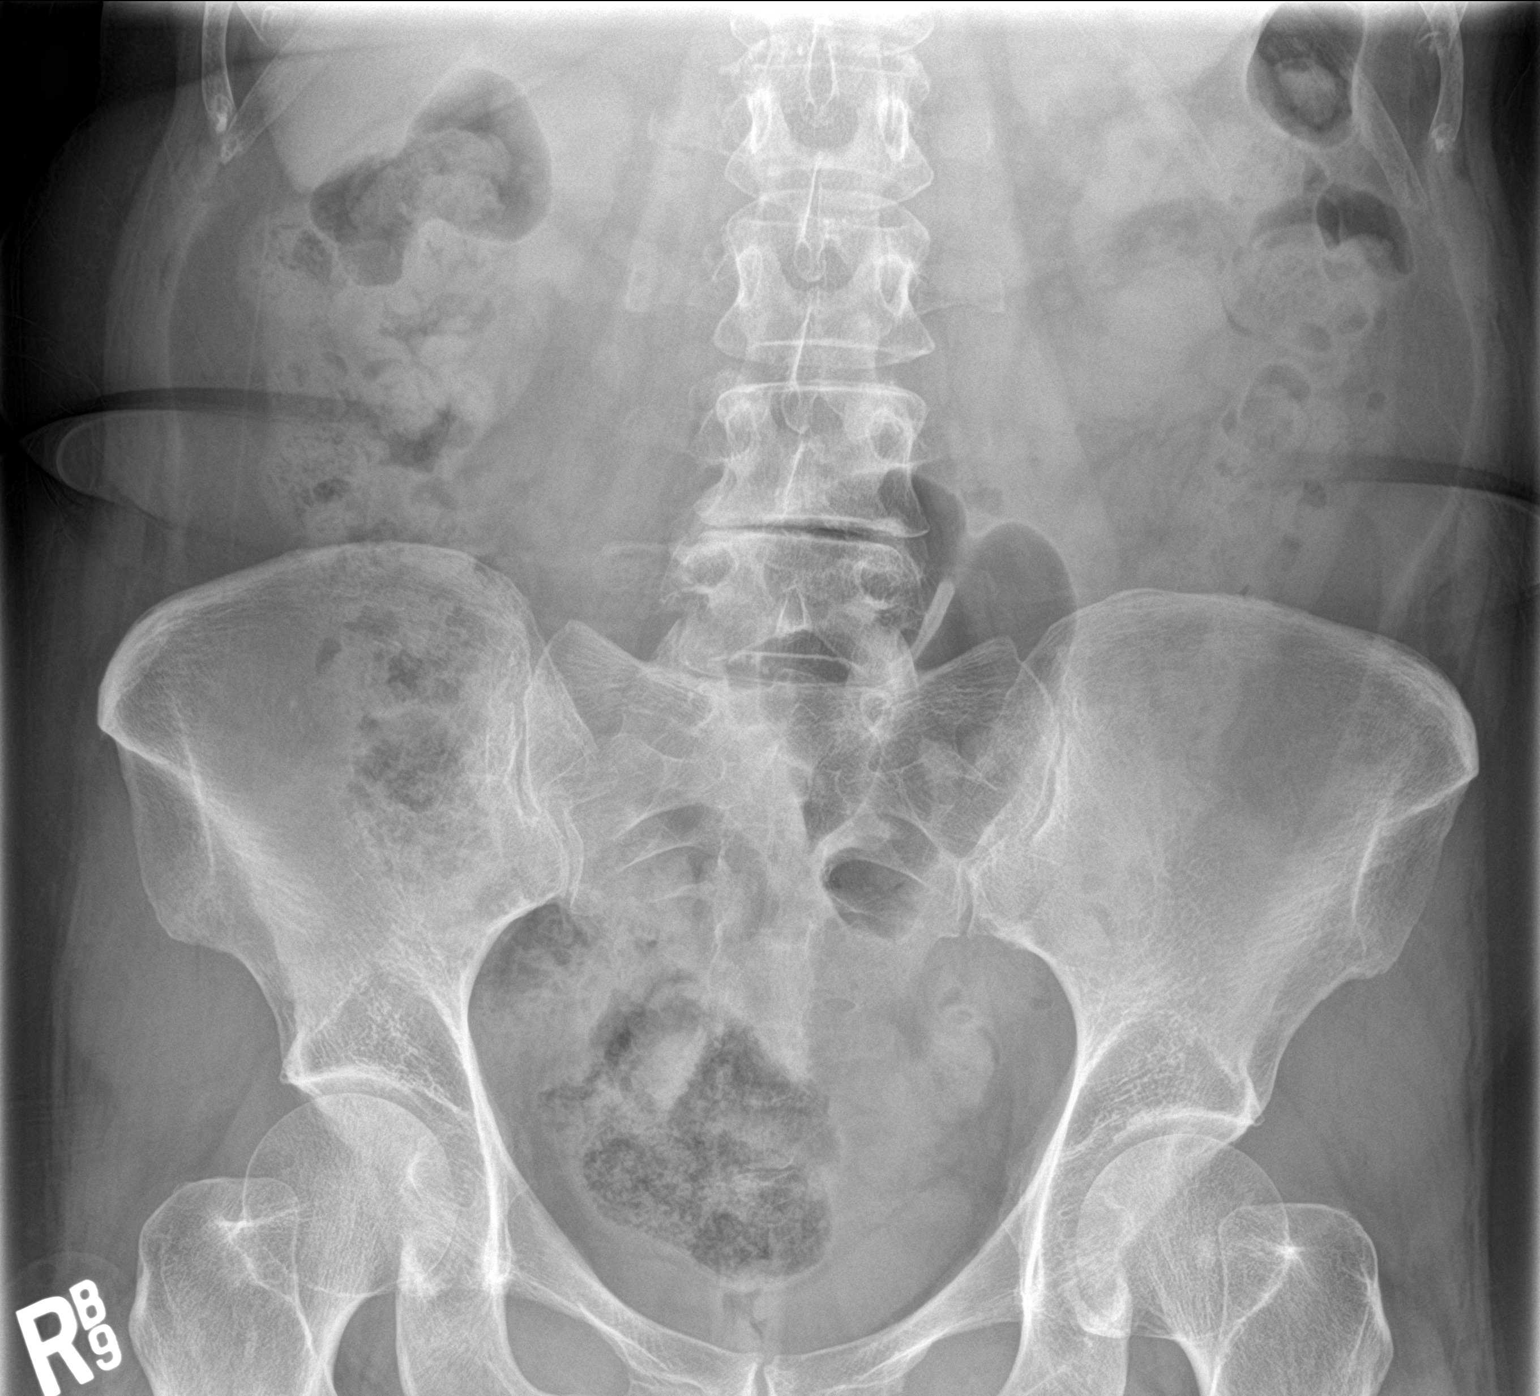
[im 2/2]
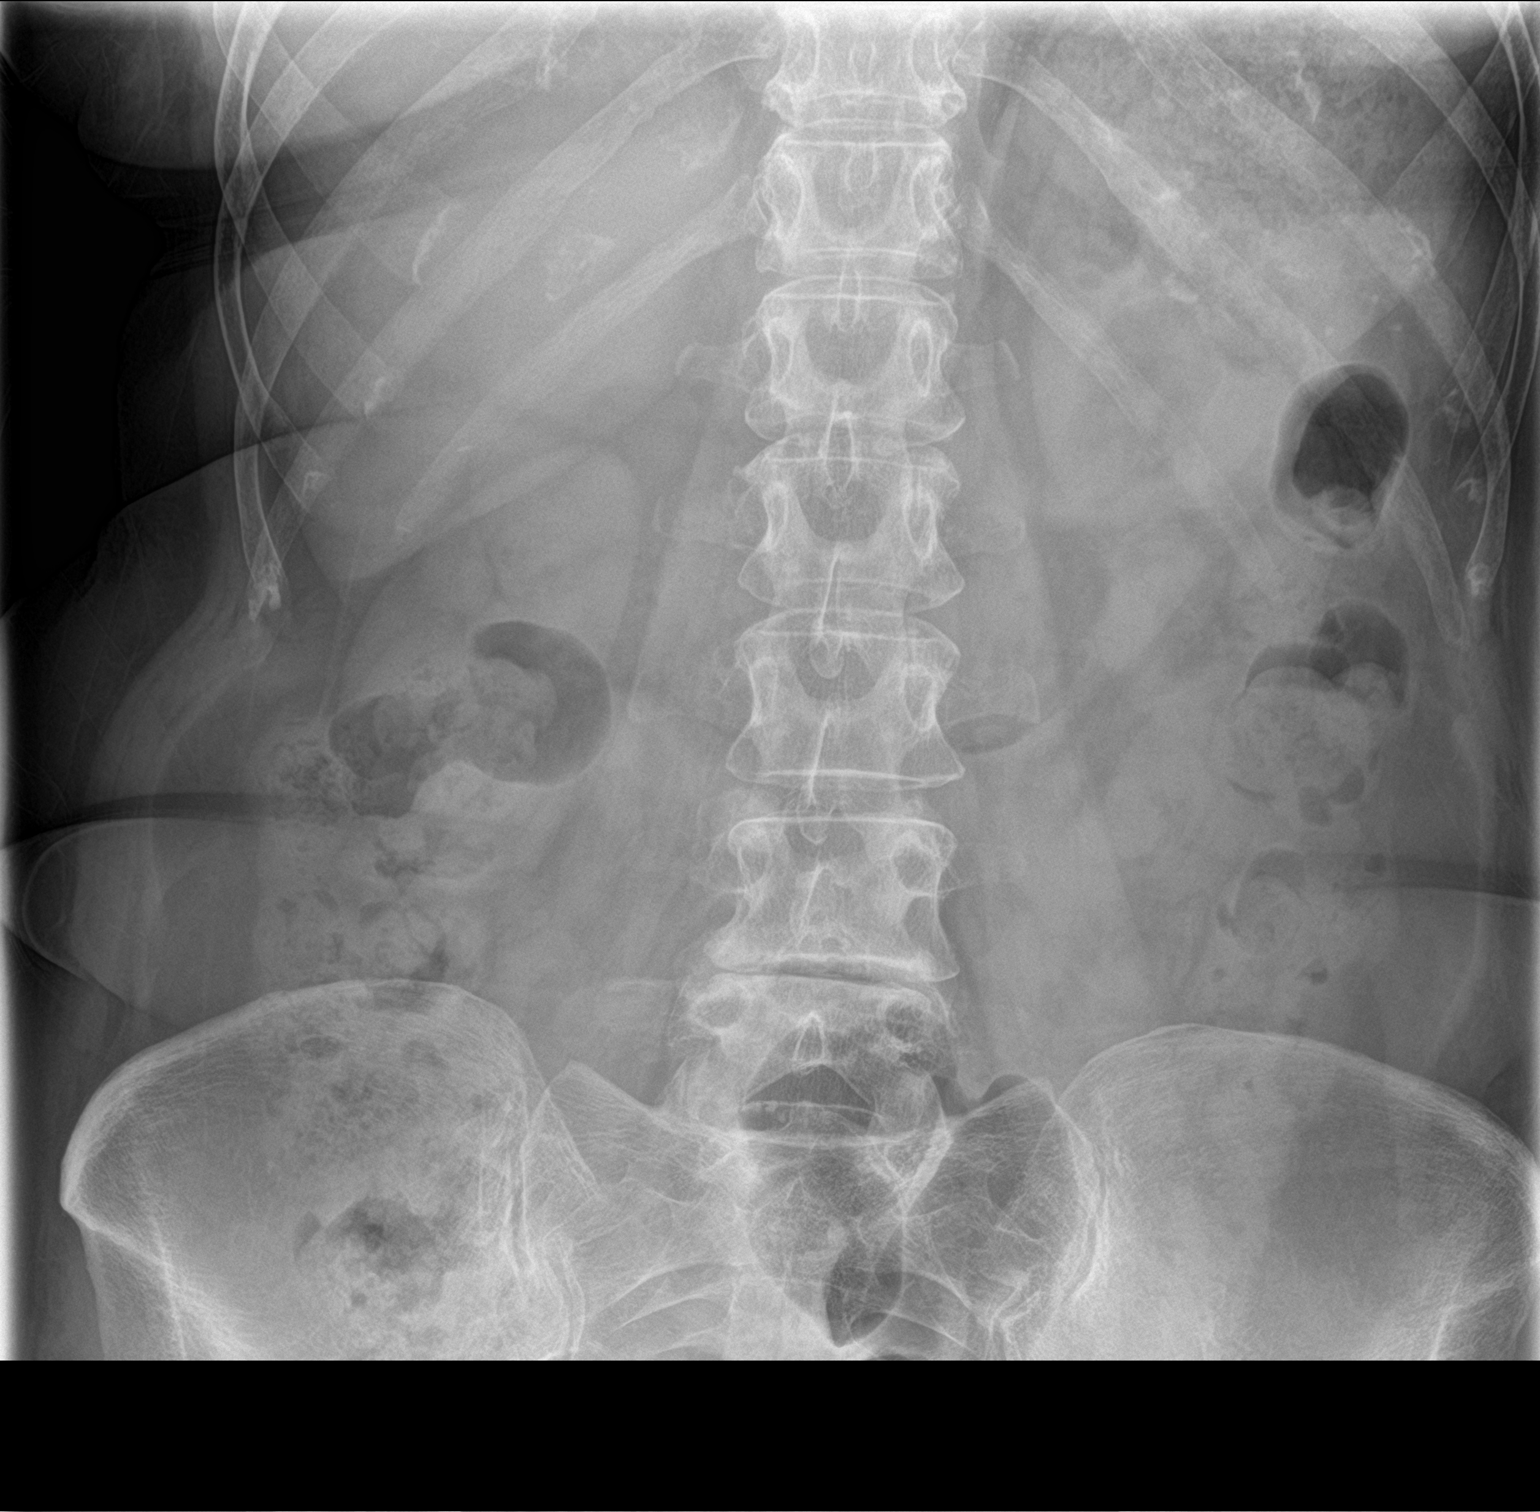

[2 of 2 positions shown; findings below may reference images not displayed]

FINDINGS: The bowel gas pattern is normal. No radio-opaque calculi or other
significant radiographic abnormality are seen. Calcification seen in
right pelvis on prior exam is not well visualized currently.
IMPRESSION: Calcifications seen in right pelvis on prior exam is not well
visualized currently. No definite nephrolithiasis is noted. No
definite evidence of bowel obstruction or ileus.

## 2018-09-25 NOTE — Telephone Encounter (Signed)
Error

## 2019-04-24 IMAGING — US US RENAL
1 series · 14 of 25 positions shown · non-contrast
Comparison: KUB 12/11/2017, 12/04/2017.

CLINICAL DATA: 61-year-old female with nephrolithiasis,
hydronephrosis.

EXAM:
RENAL / URINARY TRACT ULTRASOUND COMPLETE

[Series 1: us renal · 0.23mm/px · 14 of 100 slices shown]
[im 1/100]
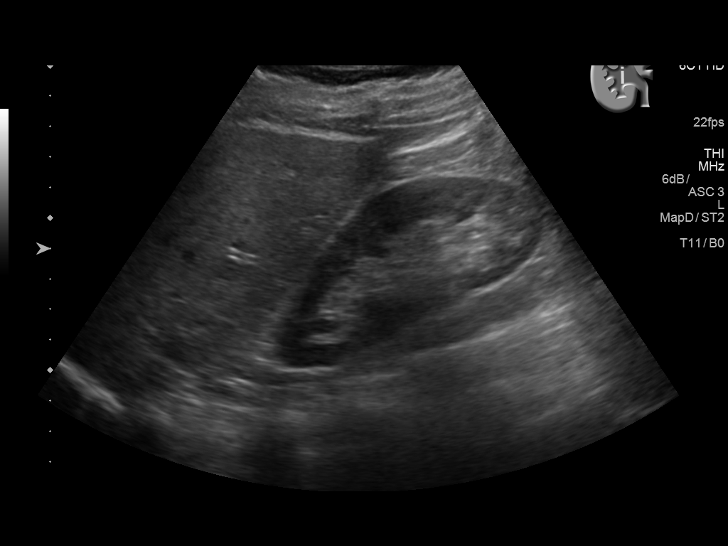
[im 9/100]
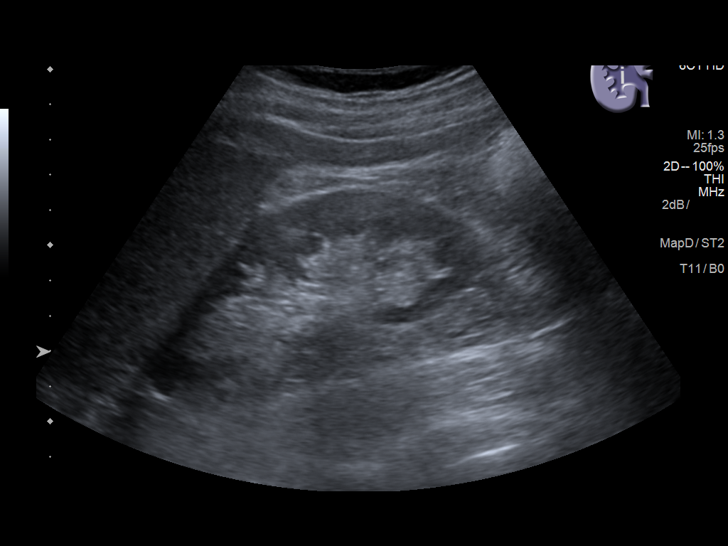
[im 17/100]
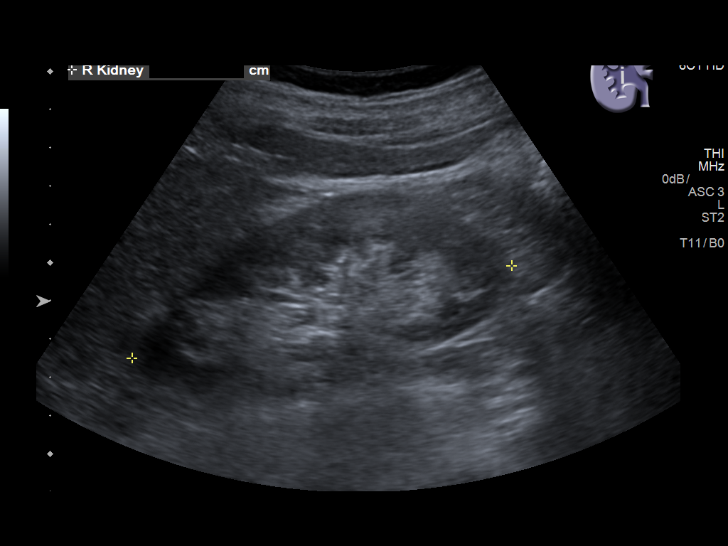
[im 25/100]
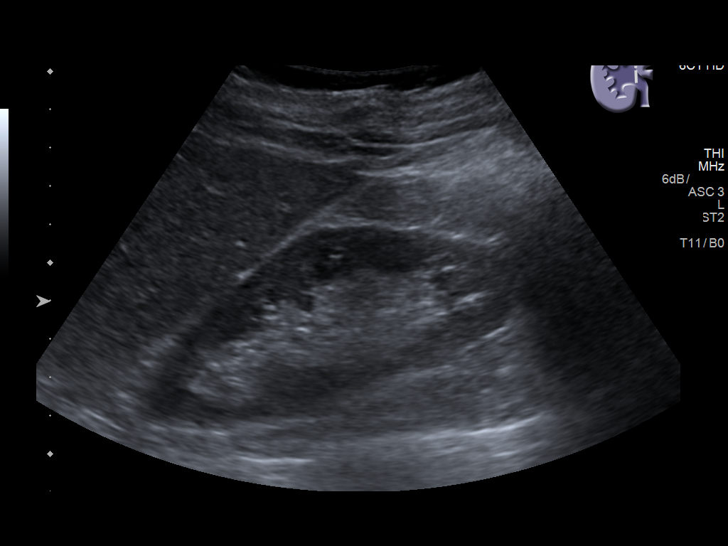
[im 34/100]
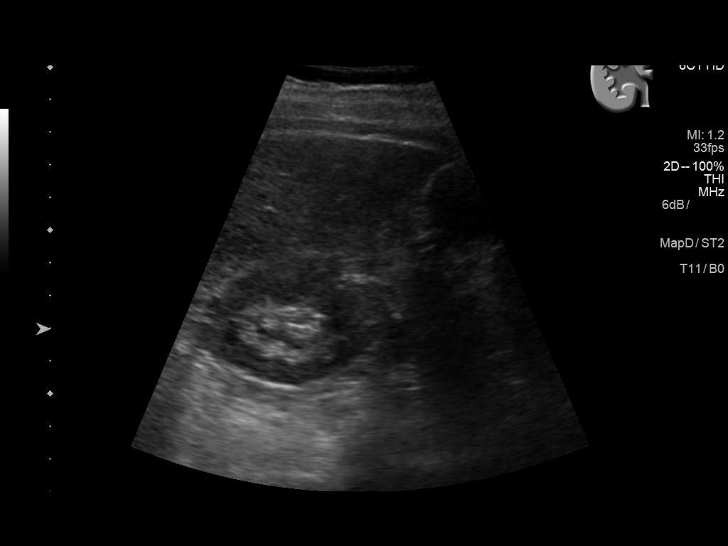
[im 38/100]
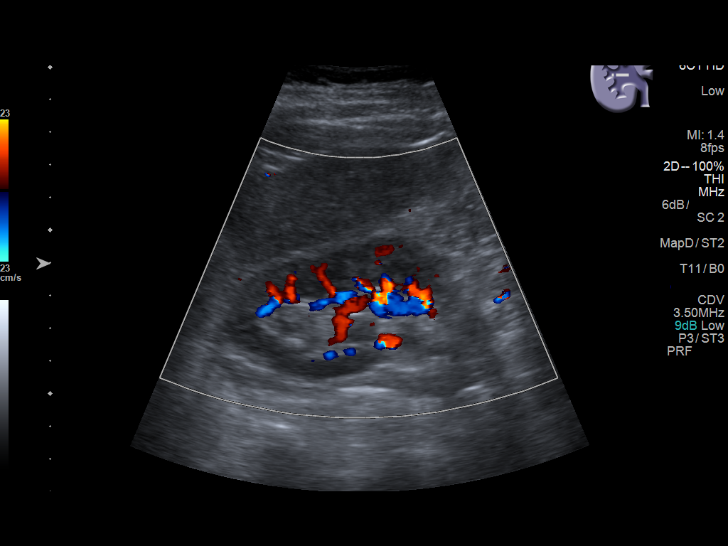
[im 46/100]
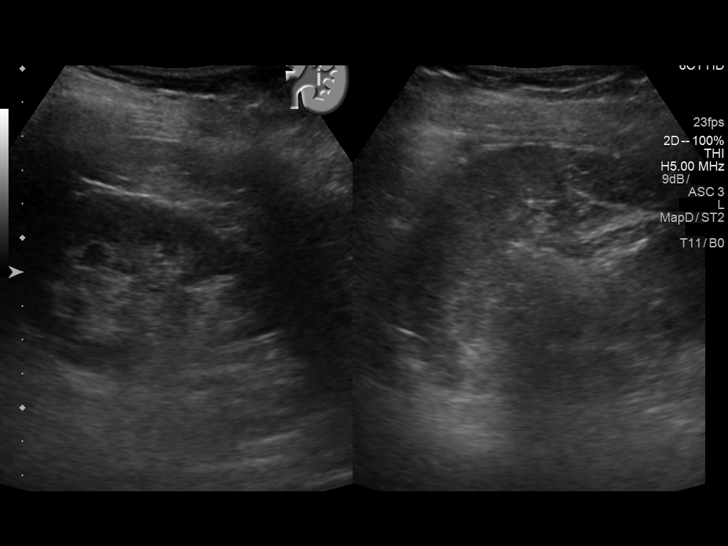
[im 54/100]
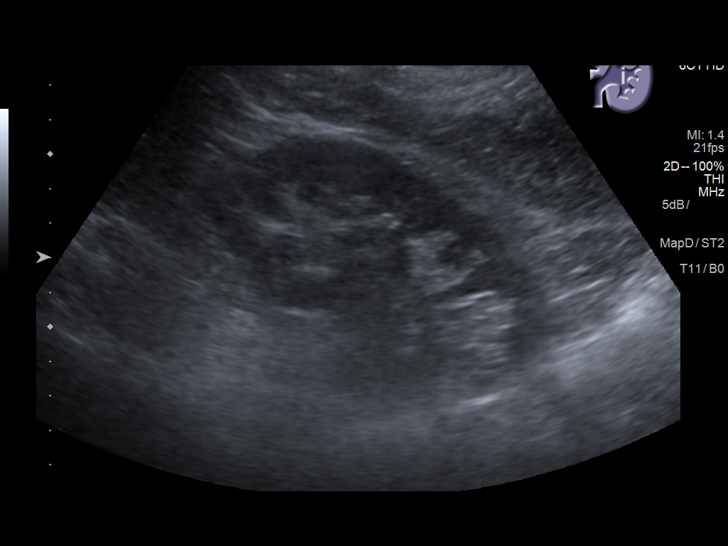
[im 62/100]
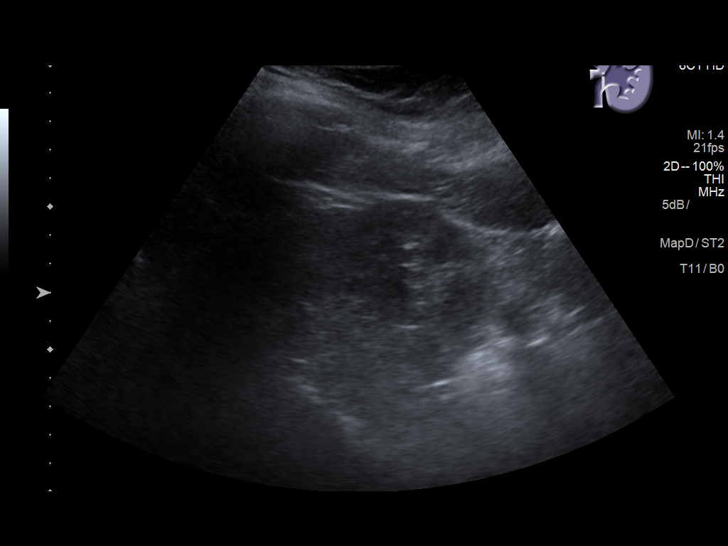
[im 67/100]
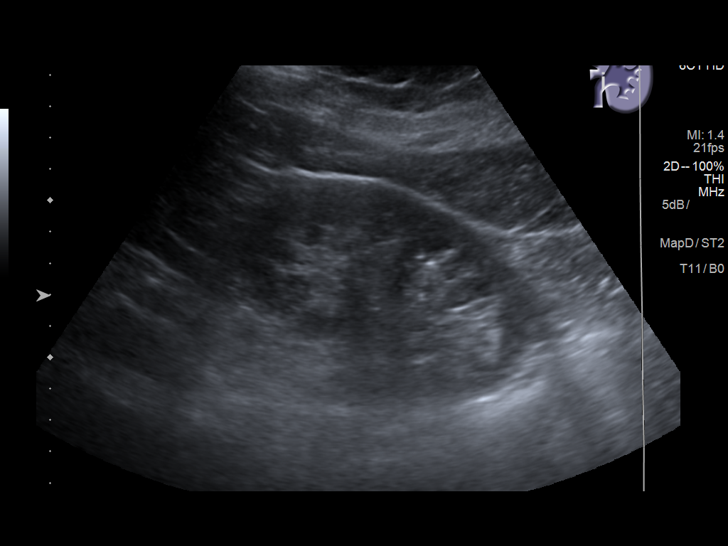
[im 75/100]
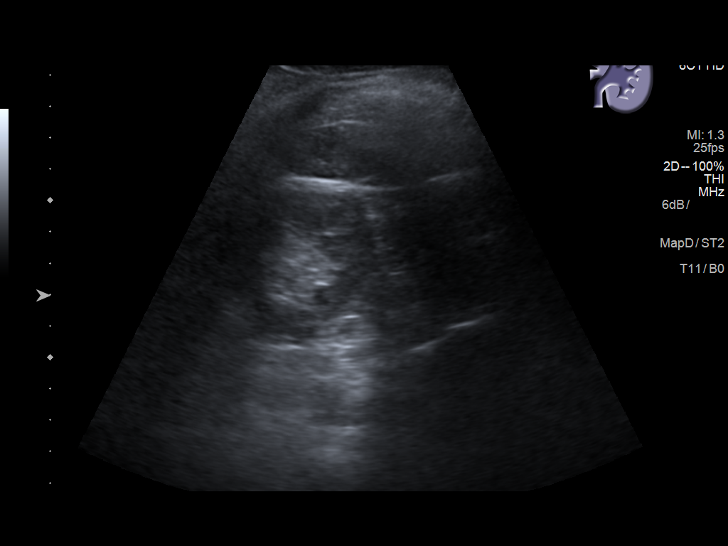
[im 83/100]
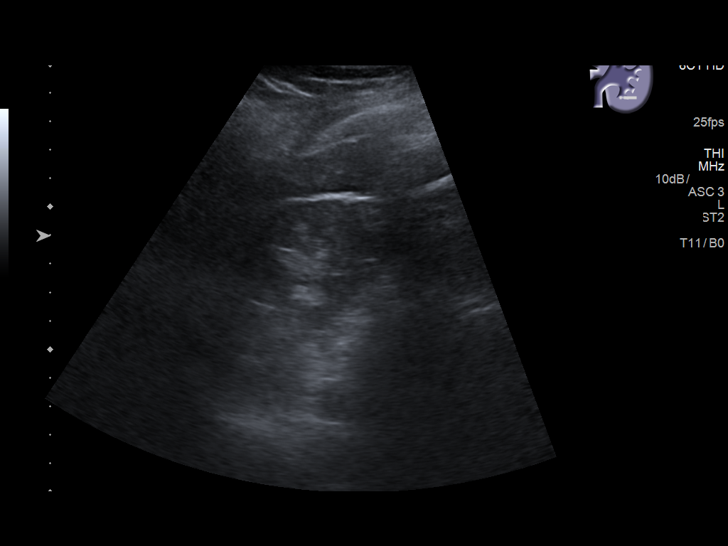
[im 91/100]
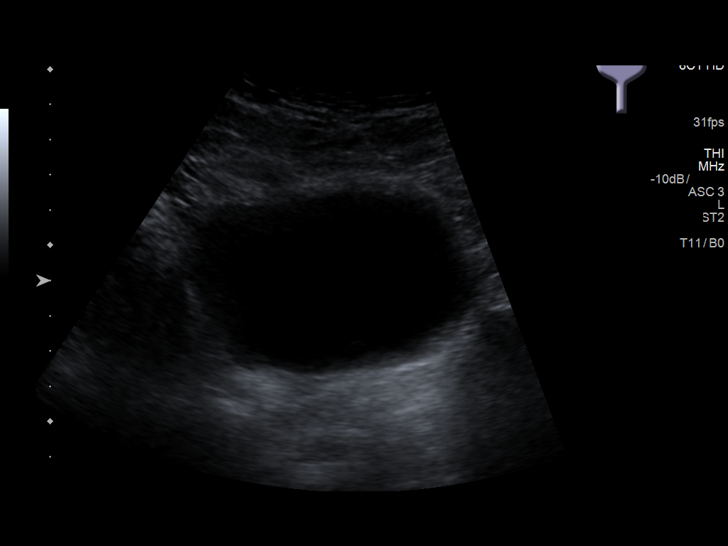
[im 100/100]
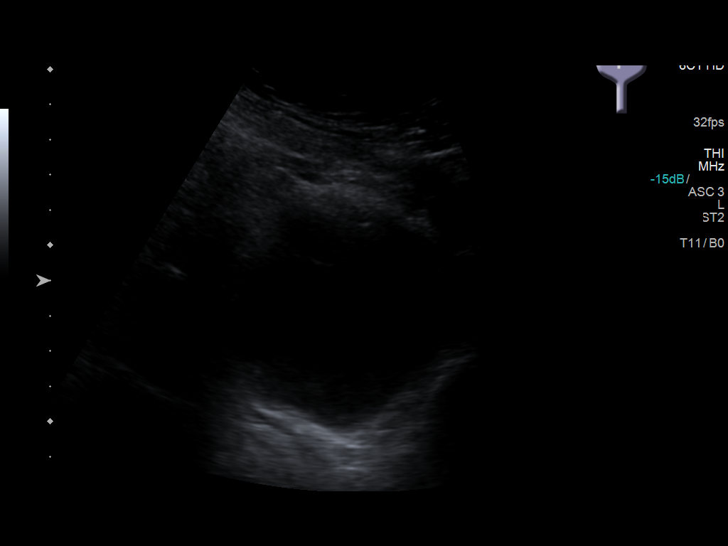

[14 of 25 positions shown; findings below may reference images not displayed]

FINDINGS: Right Kidney:

Length: 10.2 centimeters. Echogenicity within normal limits. No mass
or hydronephrosis.

Left Kidney:

Length: 10.0 centimeters. Echogenicity within normal limits. No mass
or hydronephrosis.

Bladder:

Appears normal for degree of bladder distention. Both ureteral jets
are seen with Doppler.

Other findings: Echogenic liver (image 2).
IMPRESSION: 1. Normal ultrasound appearance of both kidneys and the urinary
bladder.
2. Patent steatosis.
